# Patient Record
Sex: Female | Born: 1975 | Race: Black or African American | Hispanic: No | Marital: Single | Smoking: Former smoker
Health system: Southern US, Community
[De-identification: ages and names within clinical notes are randomized; demographics above are authoritative.]

## PROBLEM LIST (undated history)

## (undated) DIAGNOSIS — E119 Type 2 diabetes mellitus without complications: Secondary | ICD-10-CM

## (undated) DIAGNOSIS — I10 Essential (primary) hypertension: Secondary | ICD-10-CM

## (undated) HISTORY — PX: OTHER SURGICAL HISTORY: SHX169

---

## 1997-09-20 ENCOUNTER — Encounter: Admission: RE | Admit: 1997-09-20 | Discharge: 1997-09-20 | Payer: Self-pay | Admitting: Obstetrics

## 1997-09-20 ENCOUNTER — Other Ambulatory Visit: Admission: RE | Admit: 1997-09-20 | Discharge: 1997-09-20 | Payer: Self-pay | Admitting: Obstetrics

## 1998-07-16 ENCOUNTER — Encounter: Payer: Self-pay | Admitting: Emergency Medicine

## 1998-07-16 ENCOUNTER — Emergency Department (HOSPITAL_COMMUNITY): Admission: EM | Admit: 1998-07-16 | Discharge: 1998-07-16 | Payer: Self-pay | Admitting: Emergency Medicine

## 2002-05-02 ENCOUNTER — Emergency Department (HOSPITAL_COMMUNITY): Admission: EM | Admit: 2002-05-02 | Discharge: 2002-05-02 | Payer: Self-pay | Admitting: Emergency Medicine

## 2002-05-09 ENCOUNTER — Emergency Department (HOSPITAL_COMMUNITY): Admission: EM | Admit: 2002-05-09 | Discharge: 2002-05-09 | Payer: Self-pay | Admitting: Emergency Medicine

## 2002-11-03 ENCOUNTER — Ambulatory Visit (HOSPITAL_COMMUNITY): Admission: RE | Admit: 2002-11-03 | Discharge: 2002-11-03 | Payer: Self-pay | Admitting: Family Medicine

## 2003-05-16 ENCOUNTER — Encounter: Admission: RE | Admit: 2003-05-16 | Discharge: 2003-05-16 | Payer: Self-pay | Admitting: Family Medicine

## 2003-05-23 ENCOUNTER — Encounter: Admission: RE | Admit: 2003-05-23 | Discharge: 2003-05-23 | Payer: Self-pay | Admitting: Family Medicine

## 2003-06-13 ENCOUNTER — Encounter: Admission: RE | Admit: 2003-06-13 | Discharge: 2003-06-13 | Payer: Self-pay | Admitting: Family Medicine

## 2003-06-18 ENCOUNTER — Ambulatory Visit (HOSPITAL_COMMUNITY): Admission: RE | Admit: 2003-06-18 | Discharge: 2003-06-18 | Payer: Self-pay | Admitting: Surgery

## 2003-11-22 ENCOUNTER — Ambulatory Visit: Payer: Self-pay | Admitting: *Deleted

## 2004-03-14 ENCOUNTER — Ambulatory Visit: Payer: Self-pay | Admitting: Internal Medicine

## 2004-04-09 ENCOUNTER — Ambulatory Visit: Payer: Self-pay | Admitting: Family Medicine

## 2004-04-10 ENCOUNTER — Encounter (INDEPENDENT_AMBULATORY_CARE_PROVIDER_SITE_OTHER): Payer: Self-pay | Admitting: Family Medicine

## 2005-11-09 ENCOUNTER — Ambulatory Visit: Payer: Self-pay | Admitting: Family Medicine

## 2006-04-26 ENCOUNTER — Ambulatory Visit: Payer: Self-pay | Admitting: Family Medicine

## 2006-10-27 ENCOUNTER — Encounter (INDEPENDENT_AMBULATORY_CARE_PROVIDER_SITE_OTHER): Payer: Self-pay | Admitting: *Deleted

## 2006-11-26 ENCOUNTER — Encounter (INDEPENDENT_AMBULATORY_CARE_PROVIDER_SITE_OTHER): Payer: Self-pay | Admitting: Family Medicine

## 2006-11-26 DIAGNOSIS — I10 Essential (primary) hypertension: Secondary | ICD-10-CM | POA: Insufficient documentation

## 2006-11-26 DIAGNOSIS — J45909 Unspecified asthma, uncomplicated: Secondary | ICD-10-CM | POA: Insufficient documentation

## 2006-11-26 DIAGNOSIS — N949 Unspecified condition associated with female genital organs and menstrual cycle: Secondary | ICD-10-CM | POA: Insufficient documentation

## 2006-11-26 DIAGNOSIS — L680 Hirsutism: Secondary | ICD-10-CM | POA: Insufficient documentation

## 2006-11-26 DIAGNOSIS — L259 Unspecified contact dermatitis, unspecified cause: Secondary | ICD-10-CM | POA: Insufficient documentation

## 2006-11-26 DIAGNOSIS — E282 Polycystic ovarian syndrome: Secondary | ICD-10-CM | POA: Insufficient documentation

## 2006-12-08 ENCOUNTER — Encounter (INDEPENDENT_AMBULATORY_CARE_PROVIDER_SITE_OTHER): Payer: Self-pay | Admitting: *Deleted

## 2006-12-08 DIAGNOSIS — E8881 Metabolic syndrome: Secondary | ICD-10-CM

## 2007-01-11 ENCOUNTER — Ambulatory Visit: Payer: Self-pay | Admitting: Family Medicine

## 2007-02-28 ENCOUNTER — Ambulatory Visit: Payer: Self-pay | Admitting: Family Medicine

## 2007-02-28 ENCOUNTER — Encounter (INDEPENDENT_AMBULATORY_CARE_PROVIDER_SITE_OTHER): Payer: Self-pay | Admitting: Family Medicine

## 2007-02-28 DIAGNOSIS — A5901 Trichomonal vulvovaginitis: Secondary | ICD-10-CM

## 2007-02-28 LAB — CONVERTED CEMR LAB
Blood Glucose, Fingerstick: 96
Chlamydia, DNA Probe: NEGATIVE
Ketones, urine, test strip: NEGATIVE
Nitrite: NEGATIVE

## 2007-03-01 ENCOUNTER — Encounter (INDEPENDENT_AMBULATORY_CARE_PROVIDER_SITE_OTHER): Payer: Self-pay | Admitting: Family Medicine

## 2007-03-01 LAB — CONVERTED CEMR LAB
Albumin: 4 g/dL (ref 3.5–5.2)
CO2: 24 meq/L (ref 19–32)
Calcium: 8.9 mg/dL (ref 8.4–10.5)
Chloride: 108 meq/L (ref 96–112)
Cholesterol: 168 mg/dL (ref 0–200)
Eosinophils Relative: 1 % (ref 0–5)
GC Probe Amp, Urine: NEGATIVE
Glucose, Bld: 77 mg/dL (ref 70–99)
HCT: 42.8 % (ref 36.0–46.0)
Lymphocytes Relative: 29 % (ref 12–46)
Lymphs Abs: 2.1 10*3/uL (ref 0.7–4.0)
Neutrophils Relative %: 63 % (ref 43–77)
Platelets: 325 10*3/uL (ref 150–400)
Potassium: 4.2 meq/L (ref 3.5–5.3)
Sodium: 143 meq/L (ref 135–145)
Total Protein: 7.3 g/dL (ref 6.0–8.3)
Triglycerides: 110 mg/dL (ref <150)
WBC: 7.4 10*3/uL (ref 4.0–10.5)

## 2007-05-31 ENCOUNTER — Telehealth (INDEPENDENT_AMBULATORY_CARE_PROVIDER_SITE_OTHER): Payer: Self-pay | Admitting: Family Medicine

## 2007-05-31 ENCOUNTER — Encounter (INDEPENDENT_AMBULATORY_CARE_PROVIDER_SITE_OTHER): Payer: Self-pay | Admitting: Family Medicine

## 2008-04-04 ENCOUNTER — Encounter (INDEPENDENT_AMBULATORY_CARE_PROVIDER_SITE_OTHER): Payer: Self-pay | Admitting: Family Medicine

## 2008-04-04 ENCOUNTER — Ambulatory Visit: Payer: Self-pay | Admitting: Family Medicine

## 2008-04-04 LAB — CONVERTED CEMR LAB
Alkaline Phosphatase: 43 units/L (ref 39–117)
BUN: 8 mg/dL (ref 6–23)
Bilirubin Urine: NEGATIVE
Creatinine, Ser: 0.85 mg/dL (ref 0.40–1.20)
Eosinophils Absolute: 0.1 10*3/uL (ref 0.0–0.7)
Eosinophils Relative: 2 % (ref 0–5)
Glucose, Bld: 78 mg/dL (ref 70–99)
Glucose, Urine, Semiquant: NEGATIVE
HCT: 41.8 % (ref 36.0–46.0)
Hemoglobin: 14.3 g/dL (ref 12.0–15.0)
Hgb A1c MFr Bld: 6.1 %
Lymphs Abs: 2.3 10*3/uL (ref 0.7–4.0)
MCV: 89.3 fL (ref 78.0–100.0)
Monocytes Absolute: 0.6 10*3/uL (ref 0.1–1.0)
Platelets: 319 10*3/uL (ref 150–400)
Sodium: 142 meq/L (ref 135–145)
TSH: 0.913 microintl units/mL (ref 0.350–4.50)
Total Bilirubin: 0.5 mg/dL (ref 0.3–1.2)
Total Protein: 8 g/dL (ref 6.0–8.3)
Urobilinogen, UA: 0.2
WBC: 7.3 10*3/uL (ref 4.0–10.5)
pH: 5

## 2008-04-13 ENCOUNTER — Telehealth (INDEPENDENT_AMBULATORY_CARE_PROVIDER_SITE_OTHER): Payer: Self-pay | Admitting: Family Medicine

## 2008-05-14 ENCOUNTER — Encounter (INDEPENDENT_AMBULATORY_CARE_PROVIDER_SITE_OTHER): Payer: Self-pay | Admitting: Family Medicine

## 2009-01-24 ENCOUNTER — Ambulatory Visit: Payer: Self-pay | Admitting: Physician Assistant

## 2009-01-24 DIAGNOSIS — M549 Dorsalgia, unspecified: Secondary | ICD-10-CM | POA: Insufficient documentation

## 2009-01-24 LAB — CONVERTED CEMR LAB
ALT: 12 units/L (ref 0–35)
AST: 10 units/L (ref 0–37)
Alkaline Phosphatase: 42 units/L (ref 39–117)
Blood Glucose, Fingerstick: 87
Creatinine, Ser: 0.81 mg/dL (ref 0.40–1.20)
Hgb A1c MFr Bld: 5.6 %
Sodium: 141 meq/L (ref 135–145)
Total Bilirubin: 0.6 mg/dL (ref 0.3–1.2)
Total Protein: 7.2 g/dL (ref 6.0–8.3)

## 2009-01-29 ENCOUNTER — Encounter: Payer: Self-pay | Admitting: Physician Assistant

## 2009-05-02 ENCOUNTER — Ambulatory Visit: Payer: Self-pay | Admitting: Physician Assistant

## 2009-05-02 DIAGNOSIS — J039 Acute tonsillitis, unspecified: Secondary | ICD-10-CM

## 2009-05-02 DIAGNOSIS — R82998 Other abnormal findings in urine: Secondary | ICD-10-CM

## 2009-05-03 ENCOUNTER — Encounter: Payer: Self-pay | Admitting: Physician Assistant

## 2009-05-03 DIAGNOSIS — E785 Hyperlipidemia, unspecified: Secondary | ICD-10-CM

## 2009-05-08 ENCOUNTER — Encounter: Payer: Self-pay | Admitting: Physician Assistant

## 2009-05-13 ENCOUNTER — Encounter (INDEPENDENT_AMBULATORY_CARE_PROVIDER_SITE_OTHER): Payer: Self-pay | Admitting: *Deleted

## 2009-05-29 ENCOUNTER — Ambulatory Visit: Payer: Self-pay | Admitting: Physician Assistant

## 2009-05-30 ENCOUNTER — Ambulatory Visit: Payer: Self-pay | Admitting: Physician Assistant

## 2009-05-30 LAB — CONVERTED CEMR LAB
Nitrite: NEGATIVE
Specific Gravity, Urine: 1.025
Urobilinogen, UA: 0.2
WBC Urine, dipstick: NEGATIVE
pH: 6.5

## 2009-06-05 DIAGNOSIS — R809 Proteinuria, unspecified: Secondary | ICD-10-CM | POA: Insufficient documentation

## 2009-06-14 ENCOUNTER — Encounter: Payer: Self-pay | Admitting: Physician Assistant

## 2009-09-20 ENCOUNTER — Telehealth: Payer: Self-pay | Admitting: Physician Assistant

## 2009-09-21 ENCOUNTER — Emergency Department (HOSPITAL_COMMUNITY): Admission: EM | Admit: 2009-09-21 | Discharge: 2009-09-21 | Payer: Self-pay | Admitting: Emergency Medicine

## 2009-09-26 ENCOUNTER — Ambulatory Visit: Payer: Self-pay | Admitting: Nurse Practitioner

## 2009-09-26 DIAGNOSIS — R300 Dysuria: Secondary | ICD-10-CM | POA: Insufficient documentation

## 2009-09-26 DIAGNOSIS — H10029 Other mucopurulent conjunctivitis, unspecified eye: Secondary | ICD-10-CM

## 2009-09-26 LAB — CONVERTED CEMR LAB
Glucose, Urine, Semiquant: NEGATIVE
Nitrite: NEGATIVE
Specific Gravity, Urine: 1.02
pH: 5.5

## 2009-09-27 ENCOUNTER — Encounter (INDEPENDENT_AMBULATORY_CARE_PROVIDER_SITE_OTHER): Payer: Self-pay | Admitting: Nurse Practitioner

## 2009-09-30 ENCOUNTER — Encounter (INDEPENDENT_AMBULATORY_CARE_PROVIDER_SITE_OTHER): Payer: Self-pay | Admitting: Nurse Practitioner

## 2009-11-19 ENCOUNTER — Emergency Department (HOSPITAL_COMMUNITY): Admission: EM | Admit: 2009-11-19 | Discharge: 2009-11-19 | Payer: Self-pay | Admitting: Family Medicine

## 2010-03-01 ENCOUNTER — Encounter: Payer: Self-pay | Admitting: Family Medicine

## 2010-03-02 ENCOUNTER — Encounter: Payer: Self-pay | Admitting: Family Medicine

## 2010-03-09 LAB — CONVERTED CEMR LAB
Alkaline Phosphatase: 43 units/L (ref 39–117)
Beta hcg, urine, semiquantitative: NEGATIVE
Blood in Urine, dipstick: NEGATIVE
CO2: 28 meq/L (ref 19–32)
Cholesterol, target level: 200 mg/dL
Cholesterol: 165 mg/dL (ref 0–200)
Creatinine, Ser: 0.78 mg/dL (ref 0.40–1.20)
GC Probe Amp, Genital: NEGATIVE
Glucose, Bld: 85 mg/dL (ref 70–99)
HDL goal, serum: 40 mg/dL
Ketones, urine, test strip: NEGATIVE
LDL Cholesterol: 115 mg/dL — ABNORMAL HIGH (ref 0–99)
LDL Goal: 100 mg/dL
Nitrite: NEGATIVE
Pap Smear: NEGATIVE
Protein, U semiquant: 100
Rapid HIV Screen: NEGATIVE
Sodium: 141 meq/L (ref 135–145)
Total Bilirubin: 0.8 mg/dL (ref 0.3–1.2)
Total CHOL/HDL Ratio: 5.7
Total Protein: 7.7 g/dL (ref 6.0–8.3)
Triglycerides: 107 mg/dL (ref <150)
VLDL: 21 mg/dL (ref 0–40)
Whiff Test: NEGATIVE
pH: 7

## 2010-03-13 NOTE — Letter (Signed)
Summary: *HSN Results Follow up  [facility]  [address] [place], [place] [address]   Phone: [phone]  Fax: [phone]      09/30/2009   [facility] [address] [place], [place]  [address]   Dear  Ms. Nialah Castleman,                            ____S.Drinkard,FNP   ____D. Gore,FNP       ____B. McPherson,MD   ____V. Rankins,MD    ____E. Mulberry,MD    _X___N. Daphine Deutscher, FNP  ____D. Reche Dixon, MD    ____K. Philipp Deputy, MD    ____Other     This letter is to inform you that your recent test(s):  _______Pap Smear    ____X___Lab Test     _______X-ray    ___X____ is within acceptable limits  _______ requires a medication change  _______ requires a follow-up lab visit  _______ requires a follow-up visit with your provider   Comments: Urine test done during recent office visit is normal.       _________________________________________________________ If you have any questions, please contact our office [phone].                    Sincerely,    Lehman Prom FNP [facility]

## 2010-03-13 NOTE — Letter (Signed)
Summary: COUNSELING  COUNSELING   Imported By: Arta Bruce 06/24/2009 11:17:15  _____________________________________________________________________  External Attachment:    Type:   Image     Comment:   External Document

## 2010-03-13 NOTE — Letter (Signed)
Summary: *HSN Results Follow up  [facility]  [address] [place], [place] [address]   Phone: [phone]  Fax: [phone]      05/13/2009   [facility] [address] [place], [place]  [address]   Dear  Ms. Jilliana Swint,                            ____S.Drinkard,FNP   ____D. Gore,FNP       ____B. McPherson,MD   ____V. Rankins,MD    ____E. Mulberry,MD    ____N. Daphine Deutscher, FNP  ____D. Reche Dixon, MD    ____K. Philipp Deputy, MD    ____Other     This letter is to inform you that your recent test(s):  _______Pap Smear    _______Lab Test     _______X-ray    _______ is within acceptable limits  _______ requires a medication change  _______ requires a follow-up lab visit  _______ requires a follow-up visit with your provider   Comments:  We have been trying to reach you.  Please give the office a call at your earliest convenience.       _________________________________________________________ If you have any questions, please contact our office                     Sincerely,  Armenia Shannon [facility]

## 2010-03-13 NOTE — Letter (Signed)
Summary: *HSN Results Follow up  [facility]  [address] [place], [place] [address]   Phone: [phone]  Fax: [phone]      05/08/2009   [facility] [address] [place], [place]  [address]   Dear  Ms. Gloria Burgess,                            ____S.Drinkard,FNP   ____D. Gore,FNP       ____B. McPherson,MD   ____V. Rankins,MD    ____E. Mulberry,MD    ____N. Daphine Deutscher, FNP  ____D. Reche Dixon, MD    ____K. Philipp Deputy, MD    __x__S. Alben Spittle, PA-C     This letter is to inform you that your recent test(s):  ____x___Pap Smear    _______Lab Test     _______X-ray    ____x___ is within acceptable limits  _______ requires a medication change  _______ requires a follow-up lab visit  _______ requires a follow-up visit with your provider   Comments:       _________________________________________________________ If you have any questions, please contact our office                     Sincerely,  Tereso Newcomer PA-C [facility]

## 2010-03-13 NOTE — Progress Notes (Signed)
Summary: Office Visit/DEPRESSION SCREENING  Office Visit/DEPRESSION SCREENING   Imported By: Arta Bruce 07/04/2009 12:54:14  _____________________________________________________________________  External Attachment:    Type:   Image     Comment:   External Document

## 2010-03-13 NOTE — Assessment & Plan Note (Signed)
Summary: Acute - Abdominal pain   Vital Signs:  Patient profile:   35 year old female Menstrual status:  regular LMP:     08/2009 Weight:      208.1 pounds Temp:     97.8 degrees F oral Pulse rate:   77 / minute Pulse rhythm:   regular Resp:     20 per minute BP sitting:   115 / 75  (left arm) Cuff size:   large  Vitals Entered By: Levon Hedger (September 26, 2009 11:30 AM) CC: pt went to urgent care and was treated for pink eye and dianosed with URI.  she is complaining of lowerabdominal pain x 5 days .Marland KitchenMarland Kitchenpt is asking for a pregancy test Is Patient Diabetic? No Pain Assessment Patient in pain? yes     Location: lower abdomen  Does patient need assistance? Ambulation Normal LMP (date): 08/2009 LMP - Character: normal     Menstrual flow (days): 4-5 Enter LMP: 08/2009 Last PAP Result NEGATIVE FOR INTRAEPITHELIAL LESIONS OR MALIGNANCY. R  Primary Care Provider:  Tereso Newcomer PA-C  CC:  pt went to urgent care and was treated for pink eye and dianosed with URI.  she is complaining of lowerabdominal pain x 5 days .Marland KitchenMarland Kitchenpt is asking for a pregancy test.  History of Present Illness:  Pt went to urgent care 5 days ago and was Dx with URI. Pt was started on zyrtec, of which she is taking. Pt was also dx with bacterial conjunctivitis for which she has been using eye drops.  originally in the right eye but that has since cleared since using the eye drops. Now same symptoms present in the left eye and she has started using the eye drops in that eye  Also with complaints of abdominal pain intermittent, mostly on the right side +vaginal discharge + dysuria Pt has a menses monthly (although history of PCOS noted in chart)  She has never had any children.   BM usually three times per day - soft, denies any constipation  Pt reports that she just started working at [employer] and it is physical.  Requires her to lift.    Allergies (verified): No Known Drug Allergies  Review of  Systems Eyes:  Complains of red eye. CV:  Denies chest pain or discomfort. Resp:  Denies cough. GI:  Complains of abdominal pain; denies nausea and vomiting. GU:  Complains of discharge and dysuria.  Physical Exam  General:  alert.   Eyes:  PERRLA bilaterally left eye - pink right eye - normal sclera Lungs:  normal breath sounds.   Heart:  normal rate and regular rhythm.   Abdomen:  obese BS x 4, nontender  Msk:  normal ROM.   Neurologic:  alert & oriented X3.   Skin:  color normal.   Psych:  Oriented X3.     Impression & Recommendations:  Problem # 1:  DYSURIA (ICD-788.1) will send urine for culture Orders: KOH/ WET Mount [id number])  Problem # 2:  POLYCYSTIC OVARIAN DISEASE (ICD-256.4) pt is reporting regular menses now however abdominal pain may be due to PCOS or strained muscles from work  Problem # 3:  CONJUNCTIVITIS, BACTERIAL (ICD-372.03) continue eye drops as per ER  Complete Medication List: 1)  Glucophage Xr 500 Mg Tb24 (Metformin hcl) .Marland Kitchen.. 1 tablet by mouth daily for blood sugar 2)  Lisinopril-hydrochlorothiazide 10-12.5 Mg Tabs (Lisinopril-hydrochlorothiazide) .Marland Kitchen.. 1 tablet by mouth daily for blood pressure 3)  Triamcinolone Acetonide 0.1 % Crea (Triamcinolone acetonide) .... Apply small  amount to eczema rash and rub in well two times a day until improved. 4)  Naprosyn 500 Mg Tabs (Naproxen) .... Take 1 tablet by mouth two times a day as needed for pain 5)  Proventil Hfa 108 (90 Base) Mcg/act Aers (Albuterol sulfate) .Marland Kitchen.. 1-2 puffs every 4-6 hours as needed 6)  Flovent Hfa 110 Mcg/act Aero (Fluticasone propionate  hfa) .... 2 puffs two times a day 7)  Pravastatin Sodium 20 Mg Tabs (Pravastatin sodium) .... Take 1 tab by mouth at bedtime for cholesterol  Other Orders: T-GC Probe, urine ([id number]) T-Culture, Urine ([id number])  Patient Instructions: 1)  Continue to use eye drops as ordered 2)  Your urine will be sent for culture to check for bacteria. 3)   You will be notified of the results. 4)  Abdominal pain may be from lifting at work  Laboratory Results   Urine Tests  Date/Time Received: September 26, 2009 11:44 AM   Routine Urinalysis   Color: brown Glucose: negative   (Normal Range: Negative) Bilirubin: small   (Normal Range: Negative) Ketone: trace (5)   (Normal Range: Negative) Spec. Gravity: 1.020   (Normal Range: 1.003-1.035) Blood: trace-intact   (Normal Range: Negative) pH: 5.5   (Normal Range: 5.0-8.0) Protein: negative   (Normal Range: Negative) Urobilinogen: 1.0   (Normal Range: 0-1) Nitrite: negative   (Normal Range: Negative) Leukocyte Esterace: small   (Normal Range: Negative)      Wet Mount/KOH Source: vaginal WBC/hpf: 1-5 Bacteria/hpf: rare Clue cells/hpf: none Yeast/hpf: none Trichomonas/hpf: none   Prevention & Chronic Care Immunizations   Influenza vaccine: Historical  (11/14/2002)    Tetanus booster: 02/12/2003: Historical    Pneumococcal vaccine: Not documented  Other Screening   Pap smear: NEGATIVE FOR INTRAEPITHELIAL LESIONS OR MALIGNANCY.  (05/02/2009)   Smoking status: current  (01/11/2007)  Lipids   Total Cholesterol: 165  (05/02/2009)   LDL: 115  (05/02/2009)   LDL Direct: Not documented   HDL: 29  (05/02/2009)   Triglycerides: 107  (05/02/2009)    SGOT (AST): 12  (05/02/2009)   SGPT (ALT): 10  (05/02/2009)   Alkaline phosphatase: 43  (05/02/2009)   Total bilirubin: 0.8  (05/02/2009)  Hypertension   Last Blood Pressure: 115 / 75  (09/26/2009)   Serum creatinine: 0.78  (05/02/2009)   Serum potassium 4.3  (05/02/2009)  Self-Management Support :    Hypertension self-management support: Not documented    Lipid self-management support: Not documented    Laboratory Results   Urine Tests    Routine Urinalysis   Color: brown Glucose: negative   (Normal Range: Negative) Bilirubin: small   (Normal Range: Negative) Ketone: trace (5)   (Normal Range: Negative) Spec.  Gravity: 1.020   (Normal Range: 1.003-1.035) Blood: trace-intact   (Normal Range: Negative) pH: 5.5   (Normal Range: 5.0-8.0) Protein: negative   (Normal Range: Negative) Urobilinogen: 1.0   (Normal Range: 0-1) Nitrite: negative   (Normal Range: Negative) Leukocyte Esterace: small   (Normal Range: Negative)      Wet Mount Wet Mount KOH: Negative

## 2010-03-13 NOTE — Progress Notes (Signed)
Summary: POSSIBLE PINK EYE & STREPTHROAT   Phone Note Call from Patient   Caller: Patient Reason for Call: Acute Illness Summary of Call: PT HAVE A POSSIBLE PINK EYE AND STREP THROAT AND SHE WANTS TO SEE PA WEAVER . Fuller Song HER @ [id number] THANK YOU  Initial call taken by: Cheryll Dessert,  September 20, 2009 10:55 AM  Follow-up for Phone Call        spoke with pt and she says her right eye is drain and closed and covered unable to open it.... pt says for four days now her thoart has been scatchy.... pt says her sinus is acting up... pt says she is congested in her facial area... pt says she has took advil but its not working... pt says she has been gargling with salt water for her throat... [facility]... if the script is cheap you can send to [facility] [place]... Follow-up by: Arm[place] Shannon,  September 20, 2009 12:38 PM  Additional Follow-up for Phone Call Additional follow up Details #1::        Triage RN visit Additional Follow-up by: Brynda Rim,  September 20, 2009 2:08 PM    Additional Follow-up for Phone Call Additional follow up Details #2::    Left message on answering machine for pt to return call.Marland KitchenMarland KitchenArmenia Shannon, RMA  September 20, 2009 2:43 PM   advise pt to go to urgent care since she was unable to reach for appt here Follow-up by: Arm[place] Shannon,  September 20, 2009 4:55 PM

## 2010-03-13 NOTE — Assessment & Plan Note (Signed)
Summary: cpp exam//gk   Vital Signs:  Patient profile:   35 year old female Menstrual status:  regular LMP:     04/15/2009 Height:      66.5 inches Weight:      210.7 pounds O2 Sat:      97 % Temp:     98.4 degrees F oral Pulse rate:   74 / minute Pulse rhythm:   regular Resp:     18 per minute BP sitting:   123 / 80  (left arm) Cuff size:   large  Vitals Entered By: Geanie Cooley  (May 02, 2009 10:35 AM)  Serial Vital Signs/Assessments:  Comments: 11:11 AM Peak Flows: 420,260,250 By: Vesta Mixer CMA   CC: Pt here for CPP. Pt states that she has been having some discharge it was cloudy and white, then it was a dark brown, now its back to a cloudy white color. Pt states she has a little odor but no itching. Pt states she has been having some cramping as well even when her cycle isnt on., Hypertension Management Is Patient Diabetic? No Pain Assessment Patient in pain? no      CBG Result 87  Does patient need assistance? Functional Status Self care Ambulation Normal LMP (date): 04/15/2009 LMP - Character: normal     Menstrual flow (days): 4-5 Menstrual Status regular Enter LMP: 04/15/2009 Last PAP Result NEGATIVE FOR INTRAEPITHELIAL LESIONS OR MALIGNANCY.   CC:  Pt here for CPP. Pt states that she has been having some discharge it was cloudy and white, then it was a dark brown, now its back to a cloudy white color. Pt states she has a little odor but no itching. Pt states she has been having some cramping as well even when her cycle isnt on., and Hypertension Management.  History of Present Illness: Here for CPP No h/o abnl pap. No abnl bleeding. She is c/o a white discharge.  "Clear-cloudy then brown then cloudy again."  No odor. Sexually active with one partner.  Having some pelvic pain. No dysuria.  No hematuria. No FHx of breast cancer. Not taking calcium.  PHQ9=11 - Increased social stressors.  No med for depression in past.  No suicidal ideations.     C/o cold symptoms x 2 days.  Has right sided throat pain.  No difficulty breathing.  No fever.  No cough.  No otalgia.      Asthma History    Asthma Control Assessment:    Age range: 12+ years    Symptoms: 0-2 days/week    Nighttime Awakenings: 0-2/month    Interferes w/ normal activity: no limitations    SABA use (not for EIB): 0-2 days/week    Asthma Control Assessment: Well Controlled  Hypertension History:      She denies headache, chest pain, dyspnea with exertion, peripheral edema, and syncope.  She notes no problems with any antihypertensive medication side effects.        Positive major cardiovascular risk factors include hypertension and current tobacco user.  Negative major cardiovascular risk factors include female age less than 52 years old and negative family history for ischemic heart disease.      Problems Prior to Update: 1)  Urinalysis, Abnormal  (ICD-791.9) 2)  Acute Tonsillitis  (ICD-463) 3)  Family Stress  (ICD-V61.9) 4)  Back Pain  (ICD-724.5) 5)  Examination, Routine Medical  (ICD-V70.0) 6)  Screening For Malignant Neoplasm, Cervix  (ICD-V76.2) 7)  Trichomonal Vulvovaginitis  (ICD-131.01) 8)  Screening For Malignant Neoplasm,  Cervix  (ICD-V76.2) 9)  Metabolic Syndrome X  (ICD-277.7) 10)  Dysfunctional Uterine Bleeding  (ICD-626.8) 11)  Polycystic Ovarian Disease  (ICD-256.4) 12)  Hirsutism  (ICD-704.1) 13)  Asthma, Childhood  (ICD-493.00) 14)  Dermatitis, Hands  (ICD-692.9) 15)  Hypertension  (ICD-401.9)  Allergies (verified): No Known Drug Allergies  Past History:  Past Medical History: Last updated: 01/24/2009 Current Problems:  DYSFUNCTIONAL UTERINE BLEEDING (ICD-626.8) POLYCYSTIC OVARIAN DISEASE (ICD-256.4) Metabolic Syndrome X HIRSUTISM (ICD-704.1) ASTHMA, CHILDHOOD (ICD-493.00) DERMATITIS, HANDS (ICD-692.9) HYPERTENSION (ICD-401.9)  Past Surgical History: Last updated: 11/26/2006 September 2004: Pelvic ultrasound:PCOS April 2005:  Breast abscess  Family History: Reviewed history from 01/11/2007 and no changes required. Mother living DM,HTN Father living DM   Pt denies family history of breast/uterine/cervical/ovarian/colon or lung cancers  Social History: Reviewed history from 04/04/2008 and no changes required. 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 uses MJ 3x per week. Regular exercise-yes  Review of Systems       See HPI. Rest of ROS negative.  Physical Exam  General:  alert, well-developed, and well-nourished.   Head:  normocephalic and atraumatic.   Eyes:  pupils equal, pupils round, and pupils reactive to light.   Ears:  R ear normal and L ear normal.   Nose:  no external deformity.   Mouth:  right tonsil 2-3+ and red no exudate  Neck:  supple, no thyromegaly, and no cervical lymphadenopathy.   Breasts:  skin/areolae normal, no masses, no abnormal thickening, no nipple discharge, no tenderness, and no adenopathy.   Lungs:  normal breath sounds, no crackles, and no wheezes.   Heart:  normal rate and regular rhythm.   Abdomen:  soft, non-tender, and normal bowel sounds.   Rectal:  no external abnormalities.   Genitalia:  Fundus difficult to palpate due to body habitus. Normal introitus, no external lesions, mucosa pink and moist, no vaginal or cervical lesions, no vaginal atrophy, no friaility or hemorrhage, no adnexal masses or tenderness, and vaginal discharge.   Msk:  normal ROM.   Extremities:  no edema  Neurologic:  alert & oriented X3 and cranial nerves II-XII intact.   Skin:  turgor normal.   Psych:  normally interactive, good eye contact, and not depressed appearing.     Impression & Recommendations:  Problem # 1:  FAMILY STRESS (ICD-V61.9)  PHQ9=11 denies suicidal ideations does not want meds has a 6 yo daughter that she is raising and is having probs discussed referral to LCSW and she would like to  go  Orders: Psychology Referral (Psychology)  Problem # 2:  ACUTE TONSILLITIS (ICD-463)  rx with augmentin  nsaids close f/u  Orders: Rapid Strep ([address])  Problem # 3:  ASTHMA, CHILDHOOD (ICD-493.00) stable  Her updated medication list for this problem includes:    Proventil Hfa 108 (90 Base) Mcg/act Aers (Albuterol sulfate) .Marland Kitchen... 1-2 puffs every 4-6 hours as needed    Flovent Hfa 110 Mcg/act Aero (Fluticasone propionate  hfa) .Marland Kitchen... 2 puffs two times a day  Problem # 4:  HYPERTENSION (ICD-401.9) controlled  Her updated medication list for this problem includes:    Lisinopril-hydrochlorothiazide 10-12.5 Mg Tabs (Lisinopril-hydrochlorothiazide) .Marland Kitchen... 1 tablet by mouth daily for blood pressure  Orders: T-Comprehensive Metabolic Panel [id number]) T-Urinalysis (75643-32951) EKG w/ Interpretation (93000)  Problem # 5:  METABOLIC SYNDROME X (ICD-277.7) check labs  Orders: T-Comprehensive Metabolic Panel ([id number]) T-Lipid Profile ([id number])  Problem # 6:  SCREENING FOR MALIGNANT NEOPLASM, CERVIX (ICD-V76.2)  Orders:  T-Pap Smear, Thin Prep (04540) T- GC Chlamydia (98119) KOH/ WET Mount [phone])  Problem # 7:  EXAMINATION, ROUTINE MEDICAL (ICD-V70.0)  Orders: T-HIV Antibody  (Reflex) ([id number]) T-Syphilis Test (RPR) ([id number]) T-Urinalysis (84132-44010)  Problem # 8:  TRICHOMONAL VULVOVAGINITIS (ICD-131.01) tx with metronidazole  Problem # 9:  URINALYSIS, ABNORMAL (ICD-791.9) may be related to trich infxn check culture repeat u/a in 4 weeks  Orders: T-Culture, Urine ([id number]) T- * Misc. Laboratory test [phone])  Complete Medication List: 1)  Glucophage Xr 500 Mg Tb24 (Metformin hcl) .Marland Kitchen.. 1 tablet by mouth daily for blood sugar 2)  Lisinopril-hydrochlorothiazide 10-12.5 Mg Tabs (Lisinopril-hydrochlorothiazide) .Marland Kitchen.. 1 tablet by mouth daily for blood pressure 3)  Triamcinolone Acetonide 0.1 % Crea (Triamcinolone acetonide) .... Apply small  amount to eczema rash and rub in well two times a day until improved. 4)  Naprosyn 500 Mg Tabs (Naproxen) .... Take 1 tablet by mouth two times a day as needed for pain 5)  Proventil Hfa 108 (90 Base) Mcg/act Aers (Albuterol sulfate) .Marland Kitchen.. 1-2 puffs every 4-6 hours as needed 6)  Flovent Hfa 110 Mcg/act Aero (Fluticasone propionate  hfa) .... 2 puffs two times a day 7)  Augmentin 875-125 Mg Tabs (Amoxicillin-pot clavulanate) .... Take 1 tablet by mouth two times a day for 10 days 8)  Flagyl 500 Mg Tabs (Metronidazole) .... Take 1 tablet by mouth two times a day for 7 days  Other Orders: Capillary Blood Glucose/CBG (59563)  Hypertension Assessment/Plan:      The patient's hypertensive risk group is category B: At least one risk factor (excluding diabetes) with no target organ damage.  Her calculated 10 year risk of coronary heart disease is 4 %.  Today's blood pressure is 123/80.  Her blood pressure goal is < 140/90.  Patient Instructions: 1)  Schedule appt with Ethelene Browns. 2)  Take ibuprofen around the clock for 3 days, then as needed. 3)  Take 400-600 mg of Ibuprofen (Advil, Motrin) with food every 4-6 hours as needed  for relief of pain or comfort of fever.  4)  You have a vaginal infection.  Your partner needs treatment as well.  [facility]. 5)  Schedule follow up with Addaline Peplinski in one week to recheck throat. Prescriptions: FLAGYL 500 MG TABS (METRONIDAZOLE) Take 1 tablet by mouth two times a day for 7 days  #14 x 0   Entered and Authorized by:   Tereso Newcomer PA-C   Signed by:   Tereso Newcomer PA-C on 05/02/2009   Method used:   Printed then faxed to ...       [facility] (retail)       [address] [place], [place]  [address]       Ph: [phone]       Fax: [phone]   RxID:   [id number] AUGMENTIN 875-125 MG TABS (AMOXICILLIN-POT CLAVULANATE) Take 1 tablet by mouth two times a day for 10 days  #20 x 0    Entered and Authorized by:   Tereso Newcomer PA-C   Signed by:   Tereso Newcomer PA-C on 05/02/2009   Method used:   Printed then faxed to ...       [facility] (retail)       [address] [place], [place]  [address]       Ph: [phone]  Fax: [phone]   RxID:   [id number]   Laboratory Results   Urine Tests  Date/Time Received: May 02, 2009 11:16 AM   Routine Urinalysis   Color: yellow Appearance: Hazy Glucose: negative   (Normal Range: Negative) Bilirubin: small   (Normal Range: Negative) Ketone: negative   (Normal Range: Negative) Spec. Gravity: 1.015   (Normal Range: 1.003-1.035) Blood: negative   (Normal Range: Negative) pH: 7.0   (Normal Range: 5.0-8.0) Protein: 100   (Normal Range: Negative) Urobilinogen: 2.0   (Normal Range: 0-1) Nitrite: negative   (Normal Range: Negative) Leukocyte Esterace: moderate   (Normal Range: Negative)    Urine HCG: negative  Blood Tests     CBG Random:: 87mg /dL  Date/Time Received: May 02, 2009 12:38 PM   [employer] Mount Source: vaginal WBC/hpf: 5-10 Bacteria/hpf: 1+ Clue cells/hpf: few  Negative whiff Yeast/hpf: none Wet Mount KOH: Negative Trichomonas/hpf: moderate  Other Tests  Rapid HIV: negative     EKG  Procedure date:  05/02/2009  Findings:      Normal sinus rhythm with rate of:  78 normal axis nonspecific IVCD no isch changes

## 2010-03-13 NOTE — Progress Notes (Signed)
Summary: Office Visit/DEPRESSION SCREENING  Office Visit/DEPRESSION SCREENING   Imported By: Arta Bruce 03/12/2009 14:43:40  _____________________________________________________________________  External Attachment:    Type:   Image     Comment:   External Document

## 2010-04-23 LAB — POCT URINALYSIS DIPSTICK
Bilirubin Urine: NEGATIVE
Nitrite: NEGATIVE
pH: 5.5 (ref 5.0–8.0)

## 2010-04-23 LAB — GC/CHLAMYDIA PROBE AMP, GENITAL
Chlamydia, DNA Probe: NEGATIVE
GC Probe Amp, Genital: NEGATIVE

## 2010-04-23 LAB — WET PREP, GENITAL: Yeast Wet Prep HPF POC: NONE SEEN

## 2010-04-23 LAB — POCT PREGNANCY, URINE: Preg Test, Ur: NEGATIVE

## 2010-05-20 ENCOUNTER — Other Ambulatory Visit: Payer: Self-pay | Admitting: Nurse Practitioner

## 2010-06-04 ENCOUNTER — Ambulatory Visit (INDEPENDENT_AMBULATORY_CARE_PROVIDER_SITE_OTHER): Payer: Self-pay

## 2010-06-04 ENCOUNTER — Inpatient Hospital Stay (INDEPENDENT_AMBULATORY_CARE_PROVIDER_SITE_OTHER)
Admission: RE | Admit: 2010-06-04 | Discharge: 2010-06-04 | Disposition: A | Discharge status: Home / Self Care | Payer: Self-pay | Source: Ambulatory Visit | Attending: Emergency Medicine | Admitting: Emergency Medicine

## 2010-06-04 DIAGNOSIS — J069 Acute upper respiratory infection, unspecified: Secondary | ICD-10-CM

## 2010-06-04 DIAGNOSIS — J45909 Unspecified asthma, uncomplicated: Secondary | ICD-10-CM

## 2010-06-27 NOTE — Op Note (Signed)
NAME:  [name], PECKHAM                          ACCOUNT NO.:  [id number]   MEDICAL RECORD NO.:  [id number]                   PATIENT TYPE:  AMB   LOCATION:  DAY                                  FACILITY:  [facility]   PHYSICIAN:  Abigail Miyamoto, M.D.              DATE OF BIRTH:  October 09, 1975   DATE OF PROCEDURE:  06/18/2003  DATE OF DISCHARGE:                                 OPERATIVE REPORT   PREOPERATIVE DIAGNOSES:  Chronic left breast abscess.   POSTOPERATIVE DIAGNOSES:  Chronic left breast abscess.   PROCEDURE:  Incision and drainage of chronic breast abscess.   SURGEON:  Abigail Miyamoto, M.D.   ANESTHESIA:  1% lidocaine, 0.25% Marcaine and monitored anesthesia care.   ESTIMATED BLOOD LOSS:  Minimal.   INDICATIONS FOR PROCEDURE:  Gloria Burgess is a 35 year old female [facility] of a chronic abscess.  She had had  several aspirations of this abscess and had resolution of her symptoms on  antibiotics; however, a 2 x 3 cm collection has persisted therefore a  decision was then made to proceed to the operating room for open drainage.   DESCRIPTION OF PROCEDURE:  The patient was brought to the operating room,  identified as Jacqulyn Mcfarland. She was placed supine on the operating table and  anesthesia was induced. Her left breast was then prepped and draped in the  usual sterile fashion.  The skin on the medial portion of the areolar was  then anesthetized with 1% lidocaine with epinephrine.  An incision was then  made with a #15 blade. The incision was carried down to the abscess cavity  which was easily identified and entered with a hemostat. The cavity was  found to contain only serous fluid without evidence of purulence. The entire  contents of the cavity was expressed out and several septations were  compressed with blunt dissection.  The entire cavity appeared otherwise  normal without any evidence of carcinoma. The wound was then irrigated with  copious amounts of normal saline.  Hemostasis was then achieved with the  electrocautery. The surrounding tissue was then again anesthetized with  0.25% Marcaine. The wound was then packed with a wet to dry saline gauze.  The gauze was then placed over this. The patient was then taken in stable  condition from the operating room to the recovery room.  She tolerated the  procedure well, all counts were correct at the end of the procedure.                                               Abigail Miyamoto, M.D.    DB/MEDQ  D:  06/18/2003  T:  06/18/2003  Job:  [id number]

## 2010-12-12 ENCOUNTER — Inpatient Hospital Stay (INDEPENDENT_AMBULATORY_CARE_PROVIDER_SITE_OTHER)
Admission: RE | Admit: 2010-12-12 | Discharge: 2010-12-12 | Disposition: A | Discharge status: Home / Self Care | Payer: Self-pay | Source: Ambulatory Visit | Attending: Emergency Medicine | Admitting: Emergency Medicine

## 2010-12-12 ENCOUNTER — Ambulatory Visit (INDEPENDENT_AMBULATORY_CARE_PROVIDER_SITE_OTHER): Payer: Self-pay

## 2010-12-12 DIAGNOSIS — S40019A Contusion of unspecified shoulder, initial encounter: Secondary | ICD-10-CM

## 2011-08-10 ENCOUNTER — Other Ambulatory Visit: Payer: Self-pay | Admitting: Family Medicine

## 2012-06-03 IMAGING — CR DG CHEST 2V
2 series · 2 of 2 positions shown · non-contrast
Comparison: None.

CLINICAL DATA: Fever, cough, congestion and shortness of breath.
Low O2 sats.

CHEST - 2 VIEW

[view not recorded (1 of 2)]
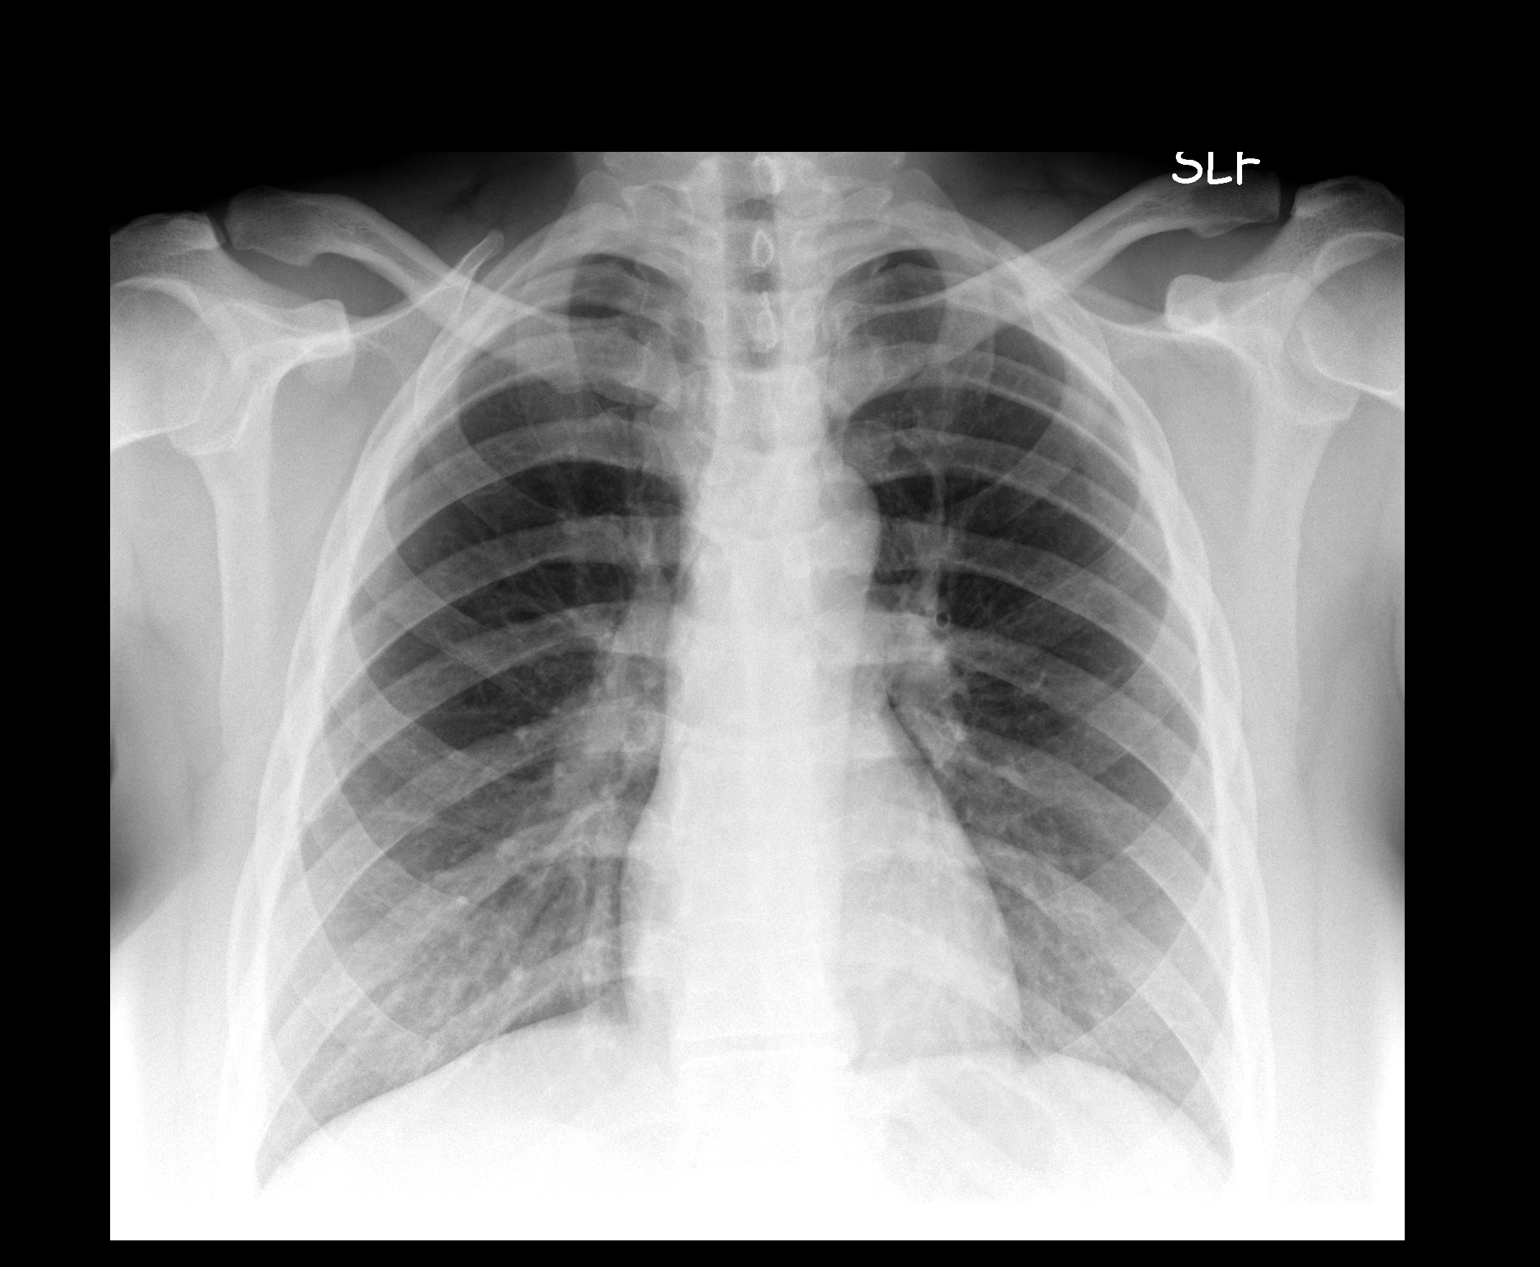

[view not recorded (2 of 2)]
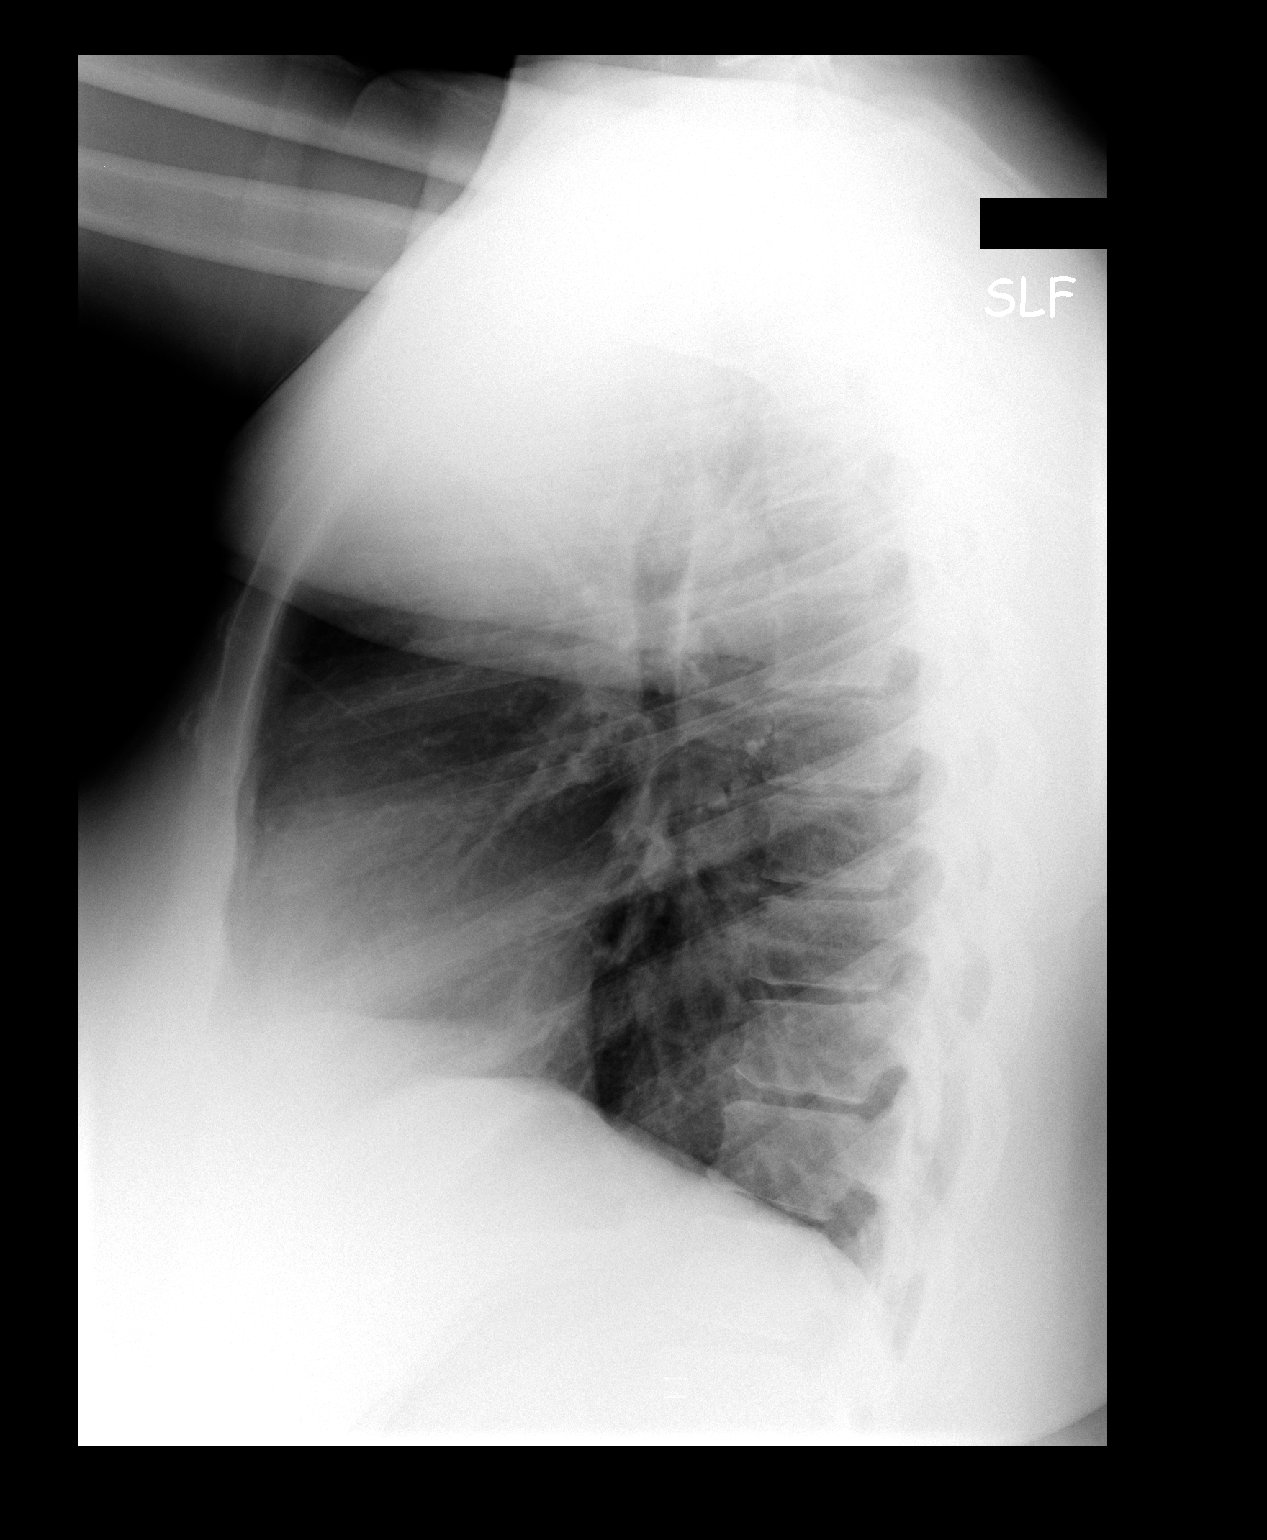

[2 of 2 positions shown; findings below may reference images not displayed]

FINDINGS: Trachea is midline.  Heart size normal.  Lungs are clear.
No pleural fluid.
IMPRESSION: No acute findings.

## 2012-06-30 ENCOUNTER — Other Ambulatory Visit: Payer: Self-pay | Admitting: Family Medicine

## 2012-06-30 DIAGNOSIS — N6320 Unspecified lump in the left breast, unspecified quadrant: Secondary | ICD-10-CM

## 2012-07-13 ENCOUNTER — Other Ambulatory Visit: Payer: Self-pay

## 2012-07-27 ENCOUNTER — Ambulatory Visit
Admission: RE | Admit: 2012-07-27 | Discharge: 2012-07-27 | Disposition: A | Discharge status: Home / Self Care | Payer: BC Managed Care – PPO | Source: Ambulatory Visit | Attending: Family Medicine | Admitting: Family Medicine

## 2012-07-27 DIAGNOSIS — N6320 Unspecified lump in the left breast, unspecified quadrant: Secondary | ICD-10-CM

## 2013-07-24 ENCOUNTER — Other Ambulatory Visit: Payer: Self-pay

## 2013-07-25 LAB — CYTOLOGY - PAP

## 2014-12-13 ENCOUNTER — Encounter (HOSPITAL_COMMUNITY): Payer: Self-pay

## 2014-12-13 ENCOUNTER — Emergency Department (INDEPENDENT_AMBULATORY_CARE_PROVIDER_SITE_OTHER)
Admission: EM | Admit: 2014-12-13 | Discharge: 2014-12-13 | Disposition: A | Discharge status: Home / Self Care | Payer: No Typology Code available for payment source | Source: Home / Self Care

## 2014-12-13 DIAGNOSIS — G540 Brachial plexus disorders: Secondary | ICD-10-CM

## 2014-12-13 MED ORDER — NAPROXEN 375 MG PO TABS: 375.0000 mg | ORAL_TABLET | Freq: Two times a day (BID) | ORAL | Status: DC

## 2014-12-13 NOTE — ED Provider Notes (Signed)
CSN: [id number]     Arrival date & time 12/13/14  1936 History   None    Chief Complaint  Patient presents with  . Arm Problem   (Consider location/radiation/quality/duration/timing/severity/associated sxs/prior Treatment) The history is provided by the patient.    History reviewed. No pertinent past medical history. History reviewed. No pertinent past surgical history. Family History  Problem Relation Age of Onset  . CAD Mother   . CAD Brother    Social History  Substance Use Topics  . Smoking status: Current Every Day Smoker  . Smokeless tobacco: None  . Alcohol Use: Yes   OB History    No data available     Review of Systems  Constitutional: Negative.   HENT: Negative.   Eyes: Negative.   Respiratory: Negative.   Cardiovascular: Negative.   Endocrine: Negative.   Genitourinary: Negative.   Musculoskeletal: Positive for myalgias.  Skin: Negative.   Allergic/Immunologic: Negative.   Neurological: Positive for numbness.  Hematological: Negative.   Psychiatric/Behavioral: Negative.     Allergies  Review of patient's allergies indicates no known allergies.  Home Medications   Prior to Admission medications   Medication Sig Start Date End Date Taking? Authorizing Provider  lisinopril-hydrochlorothiazide (PRINZIDE,ZESTORETIC) 10-12.5 MG tablet Take 1 tablet by mouth daily.   Yes Historical Provider, MD  metFORMIN (GLUCOPHAGE) 500 MG tablet Take by mouth 2 (two) times daily with a meal.   Yes Historical Provider, MD  naproxen (NAPROSYN) 375 MG tablet Take 1 tablet (375 mg total) by mouth 2 (two) times daily. 12/13/14   Deatra CanterWilliam J Shariyah Eland, FNP   Meds Ordered and Administered this Visit  Medications - No data to display  BP 126/88 mmHg  Pulse 76  Temp(Src) 99.4 F (37.4 C) (Oral)  SpO2 99% No data found.   Physical Exam  Constitutional: She appears well-developed and well-nourished.  HENT:  Head: Normocephalic and atraumatic.  Mouth/Throat: Oropharynx is  clear and moist.  Eyes: Conjunctivae and EOM are normal. Pupils are equal, round, and reactive to light.  Neck: Normal range of motion. Neck supple.  Cardiovascular: Normal rate, regular rhythm and normal heart sounds.   Pulmonary/Chest: Effort normal and breath sounds normal.  Musculoskeletal: She exhibits tenderness.  Tenderness in right shoulder with raising of right arm and elicits parasthesia.    ED Course  Procedures (including critical care time)  Labs Review Labs Reviewed - No data to display  Imaging Review No results found.   Visual Acuity Review  Right Eye Distance:   Left Eye Distance:   Bilateral Distance:    Right Eye Near:   Left Eye Near:    Bilateral Near:         MDM   1. Thoracic outlet syndrome    Naprosyn 375mg  one po bid x 10 days #20  Congratulated patient on quitting smoking and advised her to follow up with her PCP for wellness visit and discuss concerns about her family hx of CAD.  Explained to patient and reassured that at this time I feel this is a MS concern.  Anselm PancoastWilliam J Marcina Kinnison FNP    Deatra CanterWilliam J Shaneece Stockburger, FNP 12/13/14 2033

## 2014-12-13 NOTE — ED Notes (Signed)
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 (never was told what it was , still hurts) Today while sitting on bed, had sudden onset of pain and tingling in right bicep area , and started breathing fast, became sweaty for a brief period. Went outside for a walk, got better, "gave up smoking right then because I got scarred" NAD at present, w/d/color good

## 2014-12-13 NOTE — Discharge Instructions (Signed)
Thoracic Outlet Syndrome  Thoracic outlet syndrome (TOS) is a group of signs and symptoms that result when the vein, artery, or nerves that supply your arm and hand are squeezed (compressed). To reach your arm, all of these have to pass through a tight space under your collarbone and above your top rib (thoracic outlet).  There are three types of TOS:   Compression of the nerves that supply your arm and hand is called neurogenic TOS. Most people with TOS have this type.   Compression of the vein that returns blood from your arm and hand (subclavian vein) is called venous TOS.   Compression of the artery that carries blood to your arm and hand (subclavian artery) is called arterial TOS. Arterial TOS is the rarest type.  Depending on which structures are affected, you may have symptoms on one side or both sides of your body.  CAUSES   Neurogenic TOS may be caused by swelling or scarring in your neck muscles that results in the narrowing of your thoracic outlet. This leads to nerve compression. It can happen from:    Neck injuries from an auto accident (whiplash).    Falls.    Repetitive stress on your neck from working with your arms. This stress could be from using a keyboard all day or working on an assembly line.   Venous TOS may be caused by doing hard work with your arms, especially if you have to lift your arms above your head. A blood clot may form in the vein.   Arterial TOS may be caused by having an extra rib at the base of your neck (cervical rib). This rib presses on your subclavian artery. Over time, this pressure may cause a clot to form inside the artery, or the artery may weaken and balloon outward (aneurysm).  RISK FACTORS   You may be at greater risk for neurogenic TOS after a neck injury or repetitive stress on your neck.   You may be at greater risk for venous TOS if you do strenuous and repetitive work with your arms.   You may be at greater risk for arterial TOS if you were born with a  cervical rib.  Risk factors for any type of TOS include:   Being female.   Being overweight.   Having poor posture.  SIGNS AND SYMPTOMS   Your signs and symptoms will depend on the type of TOS that you have.  Signs and symptoms of neurogenic TOS may include:   Pain in your shoulder, arm, or hand.   Tingling or numbness in your shoulder, arm, or hand.   Tiredness or weakness of your shoulder, arm, or hand.   Neck pain.   Headache.  Signs and symptoms of venous TOS may include:   Pain and swelling of your whole arm.   Arm skin that is darker than usual.  Signs and symptoms of arterial TOS may include:   Pain and cramps in your arm or hand.   Pale arm skin.   Very cold hands.  All signs and symptoms of TOS may be worse when you hold your arms over your head.  DIAGNOSIS  Your health care provider may suspect TOS from your symptoms. A physical exam will be done. During the exam, your health care provider may ask you to hold your arms over your head to check whether your symptoms get worse. Tests may also be done to confirm the diagnosis and to find out what is causing TOS.   These may include:   Imaging studies, such as:    X-rays to look for a cervical rib.    A test using sound waves to create an image (ultrasound).    CT scan.    MRI.   A test that involves measuring and recording the pulses in your wrists (pulse volume recording).   A test that involves measuring the conduction speed of nerve impulses in your arm (nerve conduction velocity test).   A test in which X-rays are done after dye is injected into your subclavian artery or vein (venography or arteriography).  TREATMENT   Treatment depends on the type of TOS that you have.   Neurogenic TOS may be treated with:    Physical therapy to learn stretching exercises and good posture.    Occupational therapy to improve your workplace and home environment.    Medicine, including pain medicine, muscle relaxants, and anti-inflammatory medicine.     Surgery to remove scarred neck muscles or the first rib. This is rarely done for this type of TOS.   Venous TOS may be treated with:    Medicine, including blood thinners or blood clot dissolvers.    Surgery to remove a blood clot.    Surgery to remove the uppermost rib to make more space in the thoracic outlet.   Arterial TOS may be treated with surgery to:    Remove the cervical rib.    Remove a blood clot (thrombus).    Repair an aneurysm.  HOME CARE INSTRUCTIONS   Take medicines only as directed by your health care provider.   Maintain a healthy weight. Lose weight as directed by your health care provider.   Do stretching exercises at home as directed by your health care provider or physical therapist.   Maintain good posture.   Do not carry heavy bags over your shoulder.   Do not repetitively lift heavy objects over your head.   Take frequent breaks to stretch and rest your arms if you work at a keyboard or do other repetitive work with your hands and arms.   Keep all follow-up visits as directed by your health care provider. This is important.  SEEK MEDICAL CARE IF:   You have pain, cramps, numbness, or tingling in your arm or hand.   Your arm or hand frequently feels tired.   Your arm develops a darker skin color than usual.   Your hand feels cold.   You have frequent headaches or neckaches.  SEEK IMMEDIATE MEDICAL CARE IF:    You lose feeling in your arm or hand.   You are unable to move your fingers.   Your fingers turn a dark color.     This information is not intended to replace advice given to you by your health care provider. Make sure you discuss any questions you have with your health care provider.     Document Released: 01/16/2002 Document Revised: 02/16/2014 Document Reviewed: 06/28/2013  Elsevier Interactive Patient Education 2016 Elsevier Inc[employer]

## 2014-12-13 NOTE — ED Notes (Signed)
Pt reports losing feeling in her arm when she is sleeping.  She denies any CP or Jaw pain with this episode of pinching and tingling in her right arm.  Pt states she is very nervous as both her mother and brother have had a CABG.

## 2015-03-01 ENCOUNTER — Encounter (HOSPITAL_COMMUNITY): Payer: Self-pay | Admitting: *Deleted

## 2015-03-01 ENCOUNTER — Emergency Department (INDEPENDENT_AMBULATORY_CARE_PROVIDER_SITE_OTHER)
Admission: EM | Admit: 2015-03-01 | Discharge: 2015-03-01 | Disposition: A | Discharge status: Home / Self Care | Payer: Self-pay | Source: Home / Self Care | Attending: Family Medicine | Admitting: Family Medicine

## 2015-03-01 DIAGNOSIS — G5603 Carpal tunnel syndrome, bilateral upper limbs: Secondary | ICD-10-CM

## 2015-03-01 LAB — POCT I-STAT, CHEM 8
BUN: 5 mg/dL — ABNORMAL LOW (ref 6–20)
CALCIUM ION: 1.17 mmol/L (ref 1.12–1.23)
Chloride: 105 mmol/L (ref 101–111)
Creatinine, Ser: 0.9 mg/dL (ref 0.44–1.00)
GLUCOSE: 88 mg/dL (ref 65–99)
HCT: 42 % (ref 36.0–46.0)
HEMOGLOBIN: 14.3 g/dL (ref 12.0–15.0)
Potassium: 3.5 mmol/L (ref 3.5–5.1)
Sodium: 144 mmol/L (ref 135–145)
TCO2: 26 mmol/L (ref 0–100)

## 2015-03-01 NOTE — Discharge Instructions (Signed)
Wear splint at bedtime, if problem continues see orthopedist for further care.

## 2015-03-01 NOTE — ED Provider Notes (Signed)
CSN: [id number]     Arrival date & time 03/01/15  1308 History   First MD Initiated Contact with Patient 03/01/15 1404     Chief Complaint  Patient presents with  . Tingling   (Consider location/radiation/quality/duration/timing/severity/associated sxs/prior Treatment) Patient is a 40 y.o. female presenting with wrist pain. The history is provided by the patient.  Wrist Pain This is a chronic problem. Episode onset: sx for sev mos., works at [employer]. The problem has not changed since onset.Pertinent negatives include no chest pain and no abdominal pain. Associated symptoms comments: Denies caffeine.Marland Kitchen    History reviewed. No pertinent past medical history. History reviewed. No pertinent past surgical history. Family History  Problem Relation Age of Onset  . CAD Mother   . CAD Brother    Social History  Substance Use Topics  . Smoking status: Current Every Day Smoker  . Smokeless tobacco: None  . Alcohol Use: Yes   OB History    No data available     Review of Systems  Constitutional: Negative.   Cardiovascular: Negative for chest pain.  Gastrointestinal: Negative for abdominal pain.  Musculoskeletal: Negative for joint swelling.  Skin: Negative.   All other systems reviewed and are negative.   Allergies  Review of patient's allergies indicates no known allergies.  Home Medications   Prior to Admission medications   Medication Sig Start Date End Date Taking? Authorizing Provider  lisinopril-hydrochlorothiazide (PRINZIDE,ZESTORETIC) 10-12.5 MG tablet Take 1 tablet by mouth daily.    Historical Provider, MD  metFORMIN (GLUCOPHAGE) 500 MG tablet Take by mouth 2 (two) times daily with a meal.    Historical Provider, MD  naproxen (NAPROSYN) 375 MG tablet Take 1 tablet (375 mg total) by mouth 2 (two) times daily. 12/13/14   Deatra Canter, FNP   Meds Ordered and Administered this Visit  Medications - No data to display  BP 124/79 mmHg  Pulse 79  Temp(Src) 97.6 F  (36.4 C) (Oral)  Resp 16  SpO2 96%  LMP 03/01/2015 No data found.   Physical Exam  Constitutional: She is oriented to person, place, and time. She appears well-developed and well-nourished. No distress.  Neck: Normal range of motion. Neck supple. No thyromegaly present.  Cardiovascular: Regular rhythm and normal heart sounds.   Pulmonary/Chest: Effort normal and breath sounds normal.  Musculoskeletal: Normal range of motion. She exhibits no tenderness.  No sx at this time.  Lymphadenopathy:    She has no cervical adenopathy.  Neurological: She is alert and oriented to person, place, and time.  Skin: Skin is warm and dry.  Nursing note and vitals reviewed.   ED Course  Procedures (including critical care time)  Labs Review Labs Reviewed  POCT I-STAT, CHEM 8 - Abnormal; Notable for the following:    BUN 5 (*)    All other components within normal limits   i-stat wnl.  Imaging Review No results found.   Visual Acuity Review  Right Eye Distance:   Left Eye Distance:   Bilateral Distance:    Right Eye Near:   Left Eye Near:    Bilateral Near:         MDM   1. Carpal tunnel syndrome, bilateral        Linna Hoff, MD 03/01/15 1431

## 2015-03-01 NOTE — ED Notes (Signed)
Pt  Reports      Cramping  In  Arms    And  Tingling   In    extremitys      With    Symptoms  For  Several  Months         Worse   Yesterday         Pt  Is  On  bp  meds       She  denys  Any  Chest  Pain   She  Is  Sitting  Upright  On  The  Exam table  Speaking in  Complete  sentances

## 2016-02-26 ENCOUNTER — Emergency Department (HOSPITAL_COMMUNITY)
Admission: EM | Admit: 2016-02-26 | Discharge: 2016-02-26 | Disposition: A | Discharge status: Home / Self Care | Payer: BLUE CROSS/BLUE SHIELD | Attending: Emergency Medicine | Admitting: Emergency Medicine

## 2016-02-26 ENCOUNTER — Encounter (HOSPITAL_COMMUNITY): Payer: Self-pay | Admitting: *Deleted

## 2016-02-26 DIAGNOSIS — J45909 Unspecified asthma, uncomplicated: Secondary | ICD-10-CM | POA: Insufficient documentation

## 2016-02-26 DIAGNOSIS — I10 Essential (primary) hypertension: Secondary | ICD-10-CM | POA: Diagnosis not present

## 2016-02-26 DIAGNOSIS — K0889 Other specified disorders of teeth and supporting structures: Secondary | ICD-10-CM

## 2016-02-26 DIAGNOSIS — Z79899 Other long term (current) drug therapy: Secondary | ICD-10-CM | POA: Diagnosis not present

## 2016-02-26 DIAGNOSIS — Z87891 Personal history of nicotine dependence: Secondary | ICD-10-CM | POA: Diagnosis not present

## 2016-02-26 MED ORDER — IBUPROFEN 800 MG PO TABS: 800.0000 mg | ORAL_TABLET | Freq: Three times a day (TID) | ORAL | 0 refills | Status: DC

## 2016-02-26 MED ORDER — BUPIVACAINE-EPINEPHRINE (PF) 0.5% -1:200000 IJ SOLN: 1.8000 mL | Freq: Once | INTRAMUSCULAR | Status: DC

## 2016-02-26 MED ORDER — PENICILLIN V POTASSIUM 500 MG PO TABS: 500.0000 mg | ORAL_TABLET | Freq: Three times a day (TID) | ORAL | 0 refills | Status: DC

## 2016-02-26 NOTE — ED Triage Notes (Signed)
Pt states hx of dental carries.  Yesterday R face began to swell.

## 2016-02-26 NOTE — Discharge Instructions (Signed)
Please follow-up with your dentist for definitive care. Please take your medications as prescribed. Take all of your antibiotics, do not save or share them. Return to ED for any new or worsening symptoms as we discussed.  [facility] [employer]  [employer]  [facility]  [address]  [place], [place] [address]  Phone [phone]  [facility] [employer] [facility] [place] exemplifies the Dental School?s vision to improve the health and quality of life of all [place] by Public house managercreating leaders with a passion to care for the underserved and by leading the nation in community-based, service learning oral health education. We are committed to offering comprehensive general dental services for adults, children and special needs patients in a safe, caring and professional setting.  Appointments: Our clinic is open Monday through Friday 8:00 a.m. until 5:00 p.m. The amount of time scheduled for an appointment depends on the patient?s specific needs. We ask that you keep your appointed time for care or provide 24-hour notice of all appointment changes. Parents or legal guardians must accompany minor children.  Payment for Services: Medicaid and other insurance plans are welcome. Payment for services is due when services are rendered and may be made by cash or credit card. If you have dental insurance, we will assist you with your claim submission.   Emergencies: Emergency services will be provided Monday through Friday on a walk-in basis. Please arrive early for emergency services. After hours emergency services will be provided for patients of record as required.  Services:  Comprehensive General Dentistry  Children?s Dentistry  Oral Surgery - Extractions  Root Canals  Sealants and Tooth Colored Fillings  Crowns and Engineer, agriculturalBridges  Dentures and Partial Dentures   Implant Services  Periodontal Services and Cleanings  Cosmetic Tooth Whitening  Digital Radiography  3-D/[facility] Imaging

## 2016-02-26 NOTE — ED Provider Notes (Signed)
[facility]-EMERGENCY DEPT Provider Note   CSN: [id number] Arrival date & time: 02/26/16  [id number]     History   Chief Complaint Chief Complaint  Patient presents with  . Dental Pain  . Facial Swelling    HPI Gloria Burgess is a 41 y.o. female.  HPI here for evaluation of right upper dental pain. Symptoms started this morning and were gradual in onset. No relief with OTC medications. Reports associated right facial swelling. Denies fevers, chills, difficulty swallowing or breathing.  History reviewed. No pertinent past medical history.  Patient Active Problem List   Diagnosis Date Noted  . CONJUNCTIVITIS, BACTERIAL 09/26/2009  . DYSURIA 09/26/2009  . PROTEINURIA, MILD 06/05/2009  . DYSLIPIDEMIA 05/03/2009  . ACUTE TONSILLITIS 05/02/2009  . URINALYSIS, ABNORMAL 05/02/2009  . BACK PAIN 01/24/2009  . 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 METABOLIC SYNDROME X 12/08/2006  . POLYCYSTIC OVARIAN DISEASE 11/26/2006  . HYPERTENSION 11/26/2006  . ASTHMA, CHILDHOOD 11/26/2006  . 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 DERMATITIS, HANDS 11/26/2006  . 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 No pertinent surgical history.  OB History    No data available       Home Medications    Prior to Admission medications   Medication Sig Start Date End Date Taking? Authorizing Provider  ibuprofen (ADVIL,MOTRIN) 800 MG tablet Take 1 tablet (800 mg total) by mouth 3 (three) times daily. 02/26/16   Joycie PeekBenjamin Kamelia Lampkins, PA-C  lisinopril-hydrochlorothiazide (PRINZIDE,ZESTORETIC) 10-12.5 MG tablet Take 1 tablet by mouth daily.    Historical Provider, MD  metFORMIN (GLUCOPHAGE) 500 MG tablet Take by mouth 2 (two) times daily with a meal.    Historical Provider, MD  naproxen (NAPROSYN) 375 MG tablet Take 1 tablet (375 mg total) by mouth 2 (two) times daily. 12/13/14   Deatra CanterWilliam J Oxford, FNP  penicillin v potassium (VEETID) 500 MG tablet Take 1 tablet (500 mg total) by mouth 3 (three) times  daily. 02/26/16   Joycie PeekBenjamin Zuri Lascala, PA-C    Family History Family History  Problem Relation Age of Onset  . CAD Mother   . CAD Brother     Social History Social History  Substance Use Topics  . Smoking status: Former Smoker    Types: Cigarettes    Quit date: 03/29/2015  . Smokeless tobacco: Never Used  . Alcohol use Yes     Allergies   Patient has no known allergies.   Review of Systems Review of Systems See history of present illness  Physical Exam Updated Vital Signs BP 128/98 (BP Location: Right Arm)   Pulse 97   Temp 98.9 F (37.2 C) (Oral)   Resp 18   Ht 5\' 9"  (1.753 m)   Wt 115.7 kg   LMP 01/24/2016   SpO2 98%   BMI 37.66 kg/m   Physical Exam  Constitutional: She appears well-developed and well-nourished. No distress.  HENT:  Head: Normocephalic.  Discomfort located to right mandibular molars. Overall poor dentition. Multiple missing teeth with active caries. Mucous membranes are moist. No unilateral tonsillar swelling, uvula midline, no glossal swelling or elevation. No trismus. No fluctuance or evidence of a drainable abscess. No other evidence of emergent infection, Retropharyngeal or Peritonsillar abscess, Ludwig or Vincents angina. Tolerating secretions well. Patent airway. She does have diffuse swelling about right cheek, not consistent with cellulitis. Likely local inflammation   Eyes: Conjunctivae and EOM are normal.  Neck: Normal range of motion. Neck supple.  Pulmonary/Chest: Effort normal. No respiratory distress.  Abdominal: Soft. She exhibits  no distension.  Musculoskeletal: Normal range of motion.  Neurological: She is alert.  Skin: She is not diaphoretic.  Psychiatric: She has a normal mood and affect.    ED Treatments / Results  Labs (all labs ordered are listed, but only abnormal results are displayed) Labs Reviewed - No data to display  EKG  EKG Interpretation None       Radiology No results  found.  Procedures Procedures (including critical care time)  Medications Ordered in ED Medications  bupivacaine-epinephrine (MARCAINE W/ EPI) 0.5% -1:200000 injection 1.8 mL (not administered)     Initial Impression / Assessment and Plan / ED Course  I have reviewed the triage vital signs and the nursing notes.  Pertinent labs & imaging results that were available during my care of the patient were reviewed by me and considered in my medical decision making (see chart for details).  Clinical Course     Here for evaluation of dental pain. On exam, there is no evidence of a drainable abscess. No trismus, glossal elevation, unilateral tonsillar swelling. No evidence of retropharyngeal or peritonsillar abscess or Ludwig angina. Patient declines dental block in the ED. Discharged with outpatient dental resources. Also given prescription for anti-inflammatories and antibiotics. Overall, appears well, nontoxic and appropriate for discharge.   Final Clinical Impressions(s) / ED Diagnoses   Final diagnoses:  Dentalgia    New Prescriptions New Prescriptions   IBUPROFEN (ADVIL,MOTRIN) 800 MG TABLET    Take 1 tablet (800 mg total) by mouth 3 (three) times daily.   PENICILLIN V POTASSIUM (VEETID) 500 MG TABLET    Take 1 tablet (500 mg total) by mouth 3 (three) times daily.     Joycie Peek, PA-C 02/26/16 1610    Laurence Spates, MD 02/26/16 [phone]

## 2016-07-02 ENCOUNTER — Ambulatory Visit (HOSPITAL_COMMUNITY)
Admission: EM | Admit: 2016-07-02 | Discharge: 2016-07-02 | Disposition: A | Discharge status: Home / Self Care | Payer: PRIVATE HEALTH INSURANCE | Attending: Family Medicine | Admitting: Family Medicine

## 2016-07-02 ENCOUNTER — Encounter (HOSPITAL_COMMUNITY): Payer: Self-pay | Admitting: Emergency Medicine

## 2016-07-02 DIAGNOSIS — T148XXA Other injury of unspecified body region, initial encounter: Secondary | ICD-10-CM | POA: Diagnosis not present

## 2016-07-02 DIAGNOSIS — M791 Myalgia: Secondary | ICD-10-CM

## 2016-07-02 DIAGNOSIS — M25512 Pain in left shoulder: Secondary | ICD-10-CM | POA: Diagnosis not present

## 2016-07-02 HISTORY — DX: Essential (primary) hypertension: I10

## 2016-07-02 HISTORY — DX: Type 2 diabetes mellitus without complications: E11.9

## 2016-07-02 MED ORDER — NAPROXEN 375 MG PO TABS: 375.0000 mg | ORAL_TABLET | Freq: Two times a day (BID) | ORAL | 0 refills | Status: AC
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
## 2016-07-02 NOTE — ED Notes (Signed)
Updated work note with Hayden Rasmussendavid mabe, np permission
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
## 2016-07-02 NOTE — Discharge Instructions (Signed)
For today and tomorrow use cold compresses or ice to the muscles that are hurting and around the left shoulder. After that switch to heat. Perform small range of motion movements. Limit lifting, pulling and other movements that would involve the shoulder for the next few days. Take the naproxen as directed for pain. Recommend taking it with food. Massage may also be helpful.

## 2020-06-16 ENCOUNTER — Encounter (HOSPITAL_COMMUNITY): Payer: Self-pay | Admitting: *Deleted

## 2020-06-16 ENCOUNTER — Ambulatory Visit (HOSPITAL_COMMUNITY)
Admission: EM | Admit: 2020-06-16 | Discharge: 2020-06-16 | Disposition: A | Discharge status: Home / Self Care | Payer: BLUE CROSS/BLUE SHIELD | Attending: Sports Medicine | Admitting: Sports Medicine

## 2020-06-16 ENCOUNTER — Other Ambulatory Visit: Payer: Self-pay

## 2020-06-16 DIAGNOSIS — R202 Paresthesia of skin: Secondary | ICD-10-CM

## 2020-06-16 DIAGNOSIS — R200 Anesthesia of skin: Secondary | ICD-10-CM

## 2020-06-16 LAB — CBG MONITORING, ED: Glucose-Capillary: 109 mg/dL — ABNORMAL HIGH (ref 70–99)

## 2020-06-16 MED ORDER — PREDNISONE 10 MG PO TABS: 20.0000 mg | ORAL_TABLET | Freq: Every day | ORAL | 0 refills | Status: DC

## 2020-06-16 NOTE — ED Triage Notes (Signed)
Pt reports her hand feel numb and stiff

## 2020-06-16 NOTE — Discharge Instructions (Addendum)
Physical exam is reassuring. Your blood sugar was a little bit elevated at 109. I put you on a short course of steroids to see if that helps your symptoms. You need to follow-up with your primary care provider. I also gave you the name and number of the on-call orthopedic doctor if your symptoms persist she should contact them to be seen.  You may need further testing. Please see educational handouts. If your symptoms worsen in any way please go to the ER.

## 2020-06-16 NOTE — ED Provider Notes (Signed)
[facility]    CSN: [id number] Arrival date & time: 06/16/20  1006      History   Chief Complaint Chief Complaint  Patient presents with  . hand numbness    RT /LT    HPI Gloria Burgess is a 45 y.o. female.   Patient is a 45 year old female who presents for evaluation of the above issue.  He reports 1 week of bilateral elbow and hand numbness.  She says it is intermittent in nature.  It seems to come and go.  Seems to be worse in the morning.  She does sleep with her elbows flexed.  She denies any tick exposure or thyroid issues.  She sees [facility] for ongoing medical needs.  Saw them on February 28 had labs done that were normal according to her except for an elevation in her cholesterol.  She is on a statin.  I do not have access to those records as they are not in the electronic medical record.  She denies any accidents trauma or falls.  She denies any neck pain.  No polyuria polydipsia or polyphagia.  She does have a history of seasonal allergies but does not take any medicines on a regular basis.  She notes some shortness of breath at times but she uses an inhaler secondary to this.  Is worse with the change of seasons.  She denies any chest pain.  No abdominal or urinary symptoms.  She did stop smoking 3 months ago but is not related.  She has no personal history of MI or stroke.  No tick exposure.  No red flag signs or symptoms elicited on history.     Past Medical History:  Diagnosis Date  . Diabetes mellitus without complication (HCC)   . Hypertension     Patient Active Problem List   Diagnosis Date Noted  . CONJUNCTIVITIS, BACTERIAL 09/26/2009  . DYSURIA 09/26/2009  . PROTEINURIA, MILD 06/05/2009  . DYSLIPIDEMIA 05/03/2009  . ACUTE TONSILLITIS 05/02/2009  . URINALYSIS, ABNORMAL 05/02/2009  . BACK PAIN 01/24/2009  . 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 METABOLIC SYNDROME X 12/08/2006  . POLYCYSTIC OVARIAN DISEASE 11/26/2006  . HYPERTENSION  11/26/2006  . ASTHMA, CHILDHOOD 11/26/2006  . 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 DERMATITIS, HANDS 11/26/2006  . 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 No pertinent surgical history.  OB History   No obstetric history on file.      Home Medications    Prior to Admission medications   Medication Sig Start Date End Date Taking? Authorizing Provider  predniSONE (DELTASONE) 10 MG tablet Take 2 tablets (20 mg total) by mouth daily with breakfast. 06/16/20  Yes Delton See, MD  lisinopril-hydrochlorothiazide (PRINZIDE,ZESTORETIC) 10-12.5 MG tablet Take 1 tablet by mouth daily.    [provider]  metFORMIN (GLUCOPHAGE) 500 MG tablet Take by mouth 2 (two) times daily with a meal.    [provider]  naproxen (NAPROSYN) 375 MG tablet Take 1 tablet (375 mg total) by mouth 2 (two) times daily. 07/02/16   Hayden Rasmussen, NP  pravastatin (PRAVACHOL) 20 MG tablet Take 20 mg by mouth daily.    [provider]    Family History Family History  Problem Relation Age of Onset  . CAD Mother   . CAD Brother     Social History Social History   Tobacco Use  . Smoking status: Former Smoker    Types: Cigarettes    Quit date: 03/29/2015    Years since quitting:  5.2  . Smokeless tobacco: Never Used  Vaping Use  . Vaping Use: Never used  Substance Use Topics  . Alcohol use: Yes  . Drug use: Yes    Frequency: 7.0 times per week    Types: Marijuana     Allergies   Patient has no known allergies.   Review of Systems Review of Systems  Constitutional: Negative.  Negative for activity change, appetite change, chills, diaphoresis, fatigue and fever.  HENT: Negative.  Negative for congestion and trouble swallowing.   Eyes: Negative.  Negative for pain.  Respiratory: Negative.  Negative for cough, chest tightness, shortness of breath, wheezing and stridor.   Cardiovascular: Negative.  Negative for chest pain and palpitations.  Gastrointestinal:  Negative.  Negative for abdominal pain, diarrhea, nausea and vomiting.  Endocrine: Negative.  Negative for polydipsia, polyphagia and polyuria.  Genitourinary: Negative.  Negative for dysuria, frequency and urgency.  Musculoskeletal: Negative.  Negative for arthralgias, back pain, gait problem, joint swelling, myalgias, neck pain and neck stiffness.  Skin: Negative.  Negative for color change, pallor, rash and wound.  Neurological: Positive for numbness. Negative for dizziness, seizures, syncope, facial asymmetry, speech difficulty, weakness, light-headedness and headaches.  All other systems reviewed and are negative.    Physical Exam Triage Vital Signs ED Triage Vitals  Enc Vitals Group     BP 06/16/20 1033 122/83     Pulse Rate 06/16/20 1033 77     Resp 06/16/20 1033 18     Temp 06/16/20 1033 98.8 F (37.1 C)     Temp Source 06/16/20 1033 Oral     SpO2 06/16/20 1033 97 %     Weight --      Height --      Head Circumference --      Peak Flow --      Pain Score 06/16/20 1035 4     Pain Loc --      Pain Edu? --      Excl. in GC? --    No data found.  Updated Vital Signs BP 122/83 (BP Location: Left Arm)   Pulse 77   Temp 98.8 F (37.1 C) (Oral)   Resp 18   LMP 05/17/2020   SpO2 97%   Visual Acuity Right Eye Distance:   Left Eye Distance:   Bilateral Distance:    Right Eye Near:   Left Eye Near:    Bilateral Near:     Physical Exam Vitals and nursing note reviewed.  Constitutional:      General: She is not in acute distress.    Appearance: Normal appearance. She is not ill-appearing, toxic-appearing or diaphoretic.  HENT:     Head: Normocephalic and atraumatic.     Nose: Nose normal. No congestion or rhinorrhea.     Mouth/Throat:     Mouth: Mucous membranes are moist.     Pharynx: No oropharyngeal exudate or posterior oropharyngeal erythema.  Eyes:     General: No scleral icterus.       Right eye: No discharge.        Left eye: No discharge.      Extraocular Movements: Extraocular movements intact.     Conjunctiva/sclera: Conjunctivae normal.     Pupils: Pupils are equal, round, and reactive to light.  Cardiovascular:     Rate and Rhythm: Normal rate and regular rhythm.     Pulses: Normal pulses.     Heart sounds: Normal heart sounds. No murmur heard. No friction rub. No gallop.  Pulmonary:     Effort: Pulmonary effort is normal. No respiratory distress.     Breath sounds: Normal breath sounds. No stridor. No wheezing, rhonchi or rales.  Musculoskeletal:        General: No swelling, tenderness, deformity or signs of injury. Normal range of motion.     Cervical back: Normal range of motion and neck supple. No rigidity or tenderness.  Lymphadenopathy:     Cervical: No cervical adenopathy.  Skin:    General: Skin is warm and dry.     Capillary Refill: Capillary refill takes less than 2 seconds.     Findings: No bruising, erythema, lesion or rash.  Neurological:     General: No focal deficit present.     Mental Status: She is alert.     GCS: GCS eye subscore is 4. GCS verbal subscore is 5. GCS motor subscore is 6.     Cranial Nerves: Cranial nerves are intact.     Sensory: Sensation is intact.     Motor: Motor function is intact.     Coordination: Coordination is intact.     Gait: Gait is intact.     Deep Tendon Reflexes: Reflexes are normal and symmetric.     Reflex Scores:      Tricep reflexes are 2+ on the right side and 2+ on the left side.      Bicep reflexes are 2+ on the right side and 2+ on the left side.      Brachioradialis reflexes are 2+ on the right side and 2+ on the left side.      Patellar reflexes are 2+ on the right side and 2+ on the left side.      Achilles reflexes are 2+ on the right side and 2+ on the left side.    Comments: Palpation of the ulnar nerve bilaterally shows no significant hyperesthesia.  Full range of motion bilateral upper extremities.  Strength is well-preserved.  2+ DTRs.  Grip strength  is well-preserved bilaterally.  There is no issues with sensation on examination today.  Intact to light and deep touch.      UC Treatments / Results  Labs (all labs ordered are listed, but only abnormal results are displayed) Labs Reviewed  CBG MONITORING, ED - Abnormal; Notable for the following components:      Result Value   Glucose-Capillary 109 (*)    All other components within normal limits    EKG   Radiology No results found.  Procedures Procedures (including critical care time)  Medications Ordered in UC Medications - No data to display  Initial Impression / Assessment and Plan / UC Course  I have reviewed the triage vital signs and the nursing notes.  Pertinent labs & imaging results that were available during my care of the patient were reviewed by me and considered in my medical decision making (see chart for details).   Clinical impression: 1 week of bilateral upper extremity numbness and tingling.  May have ulnar nerve entrapment at the elbow although she says her hands are numb and is not really in the ulnar nerve distribution.  Tinel's test is negative bilaterally so does not appear to be carpal tunnel.  Neck exam is also within normal limits so I doubt cervical radiculopathy.  Treatment plan: 1.  The findings and treatment plan were discussed in detail with the patient.  Patient was in agreement. 2.  We will do point-of-care glucose.  It was mildly elevated 109.  She  should follow-up with her primary care provider to discuss this further and may be obtain a hemoglobin A1c in the future. 3.  We will give her the number for Ortho to see if this is something they may be able to assist her with.  She may need bilateral upper extremity nerve conduction studies and EMGs.  I will leave it to their discretion. 4.  We will put her on a short course of steroids. 5.  Follow-up with her primary care provider. 6.  Educational handouts provided. 7.  We will discharge  from care at this time and she was stable at discharge.  Follow-up here as needed.    Final Clinical Impressions(s) / UC Diagnoses   Final diagnoses:  Bilateral arm numbness and tingling while sleeping     Discharge Instructions     Physical exam is reassuring. Your blood sugar was a little bit elevated at 109. I put you on a short course of steroids to see if that helps your symptoms. You need to follow-up with your primary care provider. I also gave you the name and number of the on-call orthopedic doctor if your symptoms persist she should contact them to be seen.  You may need further testing. Please see educational handouts. If your symptoms worsen in any way please go to the ER.    ED Prescriptions    Medication Sig Dispense Auth. Provider   predniSONE (DELTASONE) 10 MG tablet Take 2 tablets (20 mg total) by mouth daily with breakfast. 10 tablet Delton See, MD     PDMP not reviewed this encounter.   Delton See, MD 06/16/20 1134

## 2020-06-16 NOTE — ED Notes (Signed)
Called pt 3x in lobby. No answer.

## 2020-07-15 ENCOUNTER — Other Ambulatory Visit: Payer: Self-pay | Admitting: Family Medicine

## 2020-07-15 DIAGNOSIS — R2231 Localized swelling, mass and lump, right upper limb: Secondary | ICD-10-CM

## 2020-07-17 ENCOUNTER — Ambulatory Visit
Admission: RE | Admit: 2020-07-17 | Discharge: 2020-07-17 | Disposition: A | Discharge status: Home / Self Care | Payer: BLUE CROSS/BLUE SHIELD | Source: Ambulatory Visit | Attending: Family Medicine | Admitting: Family Medicine

## 2020-07-17 ENCOUNTER — Other Ambulatory Visit: Payer: Self-pay

## 2020-07-17 DIAGNOSIS — R2231 Localized swelling, mass and lump, right upper limb: Secondary | ICD-10-CM

## 2020-08-13 ENCOUNTER — Encounter: Payer: Self-pay | Admitting: Neurology

## 2020-09-22 ENCOUNTER — Ambulatory Visit (HOSPITAL_COMMUNITY)
Admission: EM | Admit: 2020-09-22 | Discharge: 2020-09-22 | Disposition: A | Discharge status: Home / Self Care | Payer: BLUE CROSS/BLUE SHIELD | Attending: Physician Assistant | Admitting: Physician Assistant

## 2020-09-22 ENCOUNTER — Encounter (HOSPITAL_COMMUNITY): Payer: Self-pay | Admitting: Emergency Medicine

## 2020-09-22 ENCOUNTER — Other Ambulatory Visit: Payer: Self-pay

## 2020-09-22 DIAGNOSIS — R220 Localized swelling, mass and lump, head: Secondary | ICD-10-CM | POA: Diagnosis not present

## 2020-09-22 DIAGNOSIS — K047 Periapical abscess without sinus: Secondary | ICD-10-CM

## 2020-09-22 DIAGNOSIS — K0889 Other specified disorders of teeth and supporting structures: Secondary | ICD-10-CM

## 2020-09-22 MED ORDER — AMOXICILLIN-POT CLAVULANATE 875-125 MG PO TABS: 1.0000 | ORAL_TABLET | Freq: Two times a day (BID) | ORAL | 0 refills | Status: AC

## 2020-09-22 NOTE — Discharge Instructions (Signed)
Take Augmentin twice daily for 10 days.  Alternate Tylenol and ibuprofen for pain relief.  Use ice over this area to help with swelling.  Follow-up with a dentist as we discussed as you will likely have recurrent symptoms until we address your teeth.  If you have any worsening symptoms including continued swelling, fever, nausea, vomiting, difficulty speaking, difficulty swallowing, shortness of breath you need to go to the emergency room immediately as we discussed.

## 2020-09-22 NOTE — ED Provider Notes (Signed)
[facility]    CSN: [id number] Arrival date & time: 09/22/20  1009      History   Chief Complaint Chief Complaint  Patient presents with   Abscess    HPI Gloria Burgess is a 45 y.o. female.   Patient presents today with a 2-day history of worsening facial swelling related to dental abscess.  She reports pain is rated 10 on a 0-10 pain scale, localized to right upper premolars, described as throbbing, no alleviating factors identified.  She has tried ibuprofen without improvement of symptoms.  She has known broken teeth and severe dental cavities in this area but has not seen a dentist recently.  She denies any recent dental procedures.  Denies any recent antibiotic use.  She denies additional symptoms including fever, chest pain, shortness of breath, headache, nausea, vomiting.  Denies any muffled voice, difficulty swallowing, difficulty speaking.   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 No pertinent surgical history.  OB History   No obstetric history on file.      Home Medications    Prior to Admission medications   Medication Sig Start Date End Date Taking? Authorizing Provider  amoxicillin-clavulanate (AUGMENTIN) 875-125 MG tablet Take 1 tablet by mouth every 12 (twelve) hours for 10 days. 09/22/20 10/02/20 Yes Shemeca Lukasik, Noberto Retort, PA-C   lisinopril-hydrochlorothiazide (PRINZIDE,ZESTORETIC) 10-12.5 MG tablet Take 1 tablet by mouth daily.   Yes [provider]  pravastatin (PRAVACHOL) 20 MG tablet Take 20 mg by mouth daily.   Yes [provider]  metFORMIN (GLUCOPHAGE) 500 MG tablet Take by mouth 2 (two) times daily with a meal.    [provider]  naproxen (NAPROSYN) 375 MG tablet Take 1 tablet (375 mg total) by mouth 2 (two) times daily. 07/02/16   Hayden Rasmussen, NP  predniSONE (DELTASONE) 10 MG tablet Take 2 tablets (20 mg total) by mouth daily with breakfast. Patient not taking: Reported on 09/22/2020 06/16/20   Delton See, MD    Family History Family History  Problem Relation Age of Onset   CAD Mother    CAD Brother     Social History Social History   Tobacco Use   Smoking status: Former    Types: Cigarettes    Quit date: 03/29/2015    Years since quitting: 5.4   Smokeless tobacco: Never  Vaping Use   Vaping Use: Never used  Substance Use Topics   Alcohol use: Yes   Drug use: Not Currently    Frequency: 7.0 times per week    Types: Marijuana     Allergies   Patient has no known allergies.   Review of Systems Review of Systems  Constitutional:  Positive for activity change. Negative for appetite change, fatigue and fever.  HENT:  Positive for dental problem and facial swelling. Negative for congestion, sinus pressure, sneezing, sore throat, trouble swallowing and voice change.   Respiratory:  Negative for cough and shortness of breath.   Cardiovascular:  Negative for chest pain.  Gastrointestinal:  Negative for abdominal pain, diarrhea, nausea and vomiting.  Neurological:  Negative for dizziness, light-headedness and headaches.    Physical Exam Triage Vital Signs ED Triage Vitals  Enc Vitals Group     BP 09/22/20 1046 125/70     Pulse Rate 09/22/20 1046 93     Resp 09/22/20 1046 20     Temp 09/22/20 1046 98.3 F (36.8 C)     Temp Source 09/22/20 1046 Oral     SpO2  09/22/20 1046 97 %     Weight --      Height --      Head Circumference --      Peak Flow --      Pain Score 09/22/20 1043 10     Pain Loc --      Pain Edu? --      Excl. in GC? --    No data found.  Updated Vital Signs BP 125/70 (BP Location: Left Arm) Comment (BP Location): large cuff  Pulse 93   Temp 98.3 F (36.8 C) (Oral)   Resp 20   LMP 08/24/2020   SpO2 97%   Visual Acuity Right Eye Distance:   Left Eye Distance:   Bilateral Distance:    Right Eye Near:   Left Eye Near:    Bilateral Near:     Physical Exam Vitals reviewed.  Constitutional:      General: She is awake. She is not in acute distress.    Appearance: Normal appearance. She is normal weight. She is not ill-appearing.     Comments: Very pleasant female appears stated age no acute distress sitting comfortably in exam room  HENT:     Head: Normocephalic and atraumatic.     Jaw: Swelling present.      Right Ear: External ear normal.     Left Ear: External ear normal.     Nose: Nose normal.     Mouth/Throat:     Dentition: Abnormal dentition. Dental tenderness, gingival swelling, dental caries and dental abscesses present.      Comments: No evidence of Ludwig angina Cardiovascular:     Rate and Rhythm: Normal rate and regular rhythm.     Heart sounds: Normal heart sounds, S1 normal and S2 normal. No murmur heard. Pulmonary:     Effort: Pulmonary effort is normal.     Breath sounds: Normal breath sounds. No wheezing, rhonchi or rales.     Comments: Clear to auscultation bilaterally Abdominal:     Palpations: Abdomen is soft.     Tenderness: There is no abdominal tenderness.  Psychiatric:        Behavior: Behavior is cooperative.     UC Treatments / Results  Labs (all labs ordered are listed, but only abnormal results are displayed) Labs Reviewed - No data to display  EKG   Radiology No results found.  Procedures Procedures (including critical care time)  Medications Ordered in  UC Medications - No data to display  Initial Impression / Assessment and Plan / UC Course  I have reviewed the triage vital signs and the nursing notes.  Pertinent labs & imaging results that were available during my care of the patient were reviewed by me and considered in my medical decision making (see chart for details).      Symptoms consistent with dental infection.  Patient was started on Augmentin twice daily for 10  days.  Recommended she use over-the-counter analgesics to manage pain relief by alternating Tylenol and ibuprofen.  Discussed the importance of following up with a dentist as symptoms will likely recur until underlying tooth decay is addressed.  She was given contact information for local provider and encouraged to contact them to schedule appointment at her convenience.  Discussed alarm symptoms that warrant emergent evaluation including difficulty speaking, difficulty swallowing, changes in voice, high fever, nausea, vomiting.  Strict return precautions given to which patient expressed understanding.  Final Clinical Impressions(s) / UC Diagnoses   Final diagnoses:  Dental abscess  Dentalgia  Facial swelling     Discharge Instructions      Take Augmentin twice daily for 10 days.  Alternate Tylenol and ibuprofen for pain relief.  Use ice over this area to help with swelling.  Follow-up with a dentist as we discussed as you will likely have recurrent symptoms until we address your teeth.  If you have any worsening symptoms including continued swelling, fever, nausea, vomiting, difficulty speaking, difficulty swallowing, shortness of breath you need to go to the emergency room immediately as we discussed.     ED Prescriptions     Medication Sig Dispense Auth. Provider   amoxicillin-clavulanate (AUGMENTIN) 875-125 MG tablet Take 1 tablet by mouth every 12 (twelve) hours for 10 days. 20 tablet Pearse Shiffler, Noberto Retort, PA-C      PDMP not reviewed this encounter.   Jeani Hawking, PA-C 09/22/20 1111

## 2020-09-22 NOTE — ED Triage Notes (Addendum)
Patient has an abscess to right side of face.  Reports history of the same.  States she has 2-3 teeth that are broken.  Patient has taken ibuprofen with no relief.  Symptoms started yesterday morning.  Patient has swelling to right side of face, swelling from right jaw and including swelling around right eye, right side of nose/bridge of nose

## 2020-09-25 ENCOUNTER — Ambulatory Visit: Payer: PRIVATE HEALTH INSURANCE | Admitting: Neurology

## 2020-10-18 ENCOUNTER — Ambulatory Visit: Payer: PRIVATE HEALTH INSURANCE | Admitting: Neurology

## 2020-12-06 ENCOUNTER — Other Ambulatory Visit: Payer: Self-pay | Admitting: Family Medicine

## 2020-12-06 DIAGNOSIS — Z1231 Encounter for screening mammogram for malignant neoplasm of breast: Secondary | ICD-10-CM

## 2021-07-08 ENCOUNTER — Encounter: Payer: Self-pay | Admitting: Podiatry

## 2021-07-08 ENCOUNTER — Ambulatory Visit: Payer: 59 | Admitting: Podiatry

## 2021-07-08 DIAGNOSIS — L600 Ingrowing nail: Secondary | ICD-10-CM

## 2021-07-08 DIAGNOSIS — B351 Tinea unguium: Secondary | ICD-10-CM

## 2021-07-08 NOTE — Patient Instructions (Signed)

## 2021-07-14 NOTE — Progress Notes (Signed)
Subjective:   Patient ID: Gloria Burgess, female   DOB: 46 y.o.   MRN: [id number]   HPI 46 year old female presents the office today with concerns of ingrown toenail, left medial nail border which started out 2 months ago.  She had some drainage and discomfort and some swelling and redness.  Started after the nail was trimmed.  She tried soaking as well as antibiotics.  Possibly nails be trimmed as are thickened elongated as well.  Last A1c was 12.2.  It was rechecked but apparently was an "error".    Review of Systems  All other systems reviewed and are negative.  Past Medical History:  Diagnosis Date   Diabetes mellitus without complication ([place])    Hypertension     No past surgical history on file.   Current Outpatient Medications:    lisinopril-hydrochlorothiazide (PRINZIDE,ZESTORETIC) 10-12.5 MG tablet, Take 1 tablet by mouth daily., Disp: , Rfl:    metFORMIN (GLUCOPHAGE) 500 MG tablet, Take by mouth 2 (two) times daily with a meal., Disp: , Rfl:    naproxen (NAPROSYN) 375 MG tablet, Take 1 tablet (375 mg total) by mouth 2 (two) times daily., Disp: 20 tablet, Rfl: 0   pravastatin (PRAVACHOL) 20 MG tablet, Take 20 mg by mouth daily., Disp: , Rfl:    predniSONE (DELTASONE) 10 MG tablet, Take 2 tablets (20 mg total) by mouth daily with breakfast. (Patient not taking: Reported on 09/22/2020), Disp: 10 tablet, Rfl: 0  No Known Allergies       Objective:  Physical Exam  General: AAO x3, NAD  Dermatological: There is incurvation present on the left medial nail border with localized edema and erythema there is no drainage or pus noted at this time.  In general nails are hypertrophic, dystrophic.  No open lesions.  Vascular: Dorsalis Pedis artery and Posterior Tibial artery pedal pulses are 2/4 bilateral with immedate capillary fill time.  There is no pain with calf compression, swelling, warmth, erythema.   Neruologic: Grossly intact via light touch bilateral.    Musculoskeletal: Tenderness on the left medial nail border.  Muscular strength 5/5 in all groups tested bilateral.  Gait: Unassisted, Nonantalgic.       Assessment:   Ingrown toenail left medial nail border     Plan:  -Treatment options discussed including all alternatives, risks, and complications -Etiology of symptoms were discussed -At this time, we discussed partial nail removal without chemical matricectomy to the left medial due to infection.  We discussed increased A1c puts her at increased risk of complications and therefore did not proceed with chemical matricectomy.  Risks and complications were discussed with the patient for which they understand and  verbally consent to the procedure. Under sterile conditions a total of 3 mL of a mixture of 2% lidocaine plain and 0.5% Marcaine plain was infiltrated in a hallux block fashion. Once anesthetized, the skin was prepped in sterile fashion. A tourniquet was then applied. Next the medial border of the hallux nail border was sharply excised making sure to remove the entire offending nail border. Once the nail was  Removed, the area was debrided and the underlying skin was intact. The area was irrigated and hemostasis was obtained.  A dry sterile dressing was applied. After application of the dressing the tourniquet was removed and there is found to be an immediate capillary refill time to the digit. The patient tolerated the procedure well any complications. Post procedure instructions were discussed the patient for which he verbally understood. Follow-up in  one week for nail check or sooner if any problems are to arise. Discussed signs/symptoms of worsening infection and directed to call the office immediately should any occur or go directly to the emergency room. In the meantime, encouraged to call the office with any questions, concerns, changes symptoms. -Sharply debrided the other nails without any complication or bleeding.  Trula Slade DPM

## 2021-07-17 ENCOUNTER — Telehealth: Payer: Self-pay | Admitting: *Deleted

## 2021-07-17 NOTE — Telephone Encounter (Signed)
Gloria Burgess is requesting a systematic diagnostic code, one given is definity (B35.1)for billing purposes. Ref [id number]. Returned call and gave them L60.0,which she did accept.

## 2021-07-22 ENCOUNTER — Ambulatory Visit: Payer: 59 | Admitting: Podiatry

## 2021-07-22 DIAGNOSIS — L603 Nail dystrophy: Secondary | ICD-10-CM | POA: Diagnosis not present

## 2021-07-22 NOTE — Patient Instructions (Signed)

## 2021-07-29 NOTE — Progress Notes (Signed)
Subjective: 46 year old female presents the office today for follow evaluation undergoing partial nail avulsion of her left big toe.  She states been healing well.  No swelling redness or any drainage that she reports.  She still been soaking in Epsom salts.  Objective: AAO x3, NAD DP/PT pulses palpable bilaterally, CRT less than 3 seconds Status post partial nail avulsion appears, greenish tissue is still present but there is no cellulitis or ascending cellulitis.  There is no drainage or pus.  No fluctuance or crepitation.  There is no pain on exam. Nails in general still hypertrophic, dystrophic. No pain with calf compression, swelling, warmth, erythema  Assessment: Status post partial avulsion, onychomycosis  Plan: -All treatment options discussed with the patient including all alternatives, risks, complications.  -From a ingrown toenail procedure standpoint continue washing with soap and water, dry thoroughly apply a small amount of antibiotic ointment and a bandage to the day we can leave the area open at nighttime. -I reviewed the nail culture with her which did not reveal onychomycosis however more dystrophy of the nail.  In clinically the toes appear to have discoloration consistent fungus.  I would order compound cream to include antifungal medication as well as urea to help with the damage to the nails.  She complains of the other nails without weight on the big toenail until the procedure site is completely healed. -Patient encouraged to call the office with any questions, concerns, change in symptoms.   Vivi Barrack DPM

## 2021-09-25 ENCOUNTER — Encounter (HOSPITAL_COMMUNITY): Payer: Self-pay | Admitting: *Deleted

## 2021-09-25 ENCOUNTER — Ambulatory Visit (HOSPITAL_COMMUNITY)
Admission: EM | Admit: 2021-09-25 | Discharge: 2021-09-25 | Disposition: A | Discharge status: Home / Self Care | Payer: 59 | Attending: Sports Medicine | Admitting: Sports Medicine

## 2021-09-25 DIAGNOSIS — K029 Dental caries, unspecified: Secondary | ICD-10-CM | POA: Diagnosis not present

## 2021-09-25 DIAGNOSIS — K047 Periapical abscess without sinus: Secondary | ICD-10-CM | POA: Diagnosis not present

## 2021-09-25 MED ORDER — AMOXICILLIN-POT CLAVULANATE 875-125 MG PO TABS: 1.0000 | ORAL_TABLET | Freq: Two times a day (BID) | ORAL | 0 refills | Status: AC

## 2021-09-25 MED ORDER — NAPROXEN 500 MG PO TABS: 500.0000 mg | ORAL_TABLET | Freq: Two times a day (BID) | ORAL | 0 refills | Status: AC | PRN

## 2021-09-25 NOTE — ED Provider Notes (Signed)
[facility]    CSN: [id number] Arrival date & time: 09/25/21  1884      History   Chief Complaint Chief Complaint  Patient presents with   Dental Pain    HPI Gloria Burgess is a 46 y.o. female.   She presented today with chief complaint of right upper sided tooth pain.  She states her pain began on Tuesday evening and has not been improving.  It woke her up in the middle of the night last night which prompted her to come be evaluated.  She did take 1 old Augmentin antibiotic pill that she had leftover from a prior tooth infection last year.  She has also been using naproxen which has helped a little bit with her pain.  She noticed some swelling of her upper cheek this morning.  But denies any shortness of breath, fevers, chills, difficulty speaking or swallowing.  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      Home Medications    Prior to Admission medications   Medication Sig Start Date End Date Taking? Authorizing Provider  AMLODIPINE BENZOATE PO Take by mouth.   Yes [provider]  amoxicillin-clavulanate (AUGMENTIN) 875-125 MG tablet Take 1 tablet by mouth every 12 (twelve) hours for 7 days. 09/25/21 10/02/21 Yes Claudie Leach,  MD  lisinopril-hydrochlorothiazide (PRINZIDE,ZESTORETIC) 10-12.5 MG tablet Take 1 tablet by mouth daily.   Yes [provider]  metFORMIN (GLUCOPHAGE) 500 MG tablet Take by mouth 2 (two) times daily with a meal.   Yes [provider]  naproxen (NAPROSYN) 375 MG tablet Take 1 tablet (375 mg total) by mouth 2 (two) times daily. 07/02/16  Yes Mabe, Onalee Hua, NP  naproxen (NAPROSYN) 500 MG tablet Take 1 tablet (500 mg total) by mouth 2 (two) times daily as needed. 09/25/21  Yes Claudie Leach, MD  pravastatin (PRAVACHOL) 20 MG tablet Take 20 mg by mouth daily.   Yes [provider]    Family History Family History  Problem Relation Age of Onset   CAD Mother    CAD Brother     Social History Social History   Tobacco Use   Smoking status: Former    Types: Cigarettes    Quit date: 03/29/2015    Years since quitting: 6.4   Smokeless tobacco: Never  Vaping Use   Vaping Use: Never used  Substance Use Topics   Alcohol use: Yes    Comment: occasionally   Drug use: Not Currently    Types: Marijuana     Allergies   Patient has no known allergies.   Review of Systems Review of Systems as listed above in HPI   Physical Exam Triage Vital Signs ED Triage Vitals  Enc Vitals Group  BP 09/25/21 0824 130/85     Pulse Rate 09/25/21 0824 (!) 113     Resp 09/25/21 0824 18     Temp 09/25/21 0824 99.6 F (37.6 C)     Temp Source 09/25/21 0824 Oral     SpO2 09/25/21 0824 96 %     Weight --      Height --      Head Circumference --      Peak Flow --      Pain Score 09/25/21 0825 6     Pain Loc --      Pain Edu? --      Excl. in GC? --    No data found.  Updated Vital Signs BP 130/85   Pulse (!) 113   Temp 99.6 F (37.6 C) (Oral)   Resp 18   LMP 09/01/2021 (Approximate)   SpO2 96%   Physical Exam Vitals reviewed.  Constitutional:      General: She is not in acute distress.    Appearance: Normal appearance. She is obese. She is not  ill-appearing, toxic-appearing or diaphoretic.  HENT:     Head:     Jaw: Swelling present. No trismus.     Mouth/Throat:     Mouth: Mucous membranes are moist.     Dentition: Abnormal dentition. Dental caries present. No dental abscesses.     Pharynx: Oropharynx is clear. Uvula midline.      Comments: Patient had multiple broken off teeth from the gums, as dictated above by red X, accompanied by overall poor dentition.  She did have some erythema located around the back molar stub, as indicated by red circle.  No drainage from the area seen today.  Tenderness to palpation over the gums of the posterior molar. Cardiovascular:     Rate and Rhythm: Tachycardia present.     Pulses: Normal pulses.  Pulmonary:     Effort: Pulmonary effort is normal.  Neurological:     Mental Status: She is alert.      UC Treatments / Results  Labs (all labs ordered are listed, but only abnormal results are displayed) Labs Reviewed - No data to display  EKG   Radiology No results found.  Procedures Procedures (including critical care time)  Medications Ordered in UC Medications - No data to display  Initial Impression / Assessment and Plan / UC Course  I have reviewed the triage vital signs and the nursing notes.  Pertinent labs & imaging results that were available during my care of the patient were reviewed by me and considered in my medical decision making (see chart for details).     Gloria Burgess presented today with likely dental infection secondary to poor dentition.  She does not follow with a dentist regularly.  Due to the erythema and swelling around her upper posterior molar I prescribed Augmentin for 7 days, along with naproxen for pain and swelling.  Patient was given a handout at discharge of local low-cost dentistry.  She stated she would call today.  I counseled her on worsening of symptoms and to return to urgent care if she has no improvement.  She verbalized  understanding. Patient was tachycardic during my evaluation, she stated she is not feeling nervous, denies any palpitations or chest pain.  She did just have her morning coffee which is likely the culprit of her tachycardia.  Patient counseled to follow-up with her primary care provider.  Final Clinical Impressions(s) / UC Diagnoses   Final diagnoses:  Dental infection  Dental caries     Discharge Instructions      Take your antibiotic twice a day for 7 days.  You may also use naproxen twice a day as needed for pain.  I would recommend calling as soon as possible to seek appointment for dental care.  If you have any change in your symptoms, fevers, chills worsening pain before you are able to see the dentist please return to urgent care for evaluation.    ED Prescriptions     Medication Sig Dispense Auth. Provider   amoxicillin-clavulanate (AUGMENTIN) 875-125 MG tablet Take 1 tablet by mouth every 12 (twelve) hours for 7 days. 14 tablet Claudie Leach, MD   naproxen (NAPROSYN) 500 MG tablet Take 1 tablet (500 mg total) by mouth 2 (two) times daily as needed. 30 tablet Claudie Leach, MD      PDMP not reviewed this encounter.   Claudie Leach, MD 09/25/21 [phone]

## 2021-09-25 NOTE — ED Triage Notes (Signed)
C/O right upper dental pain and swelling onset 2 days ago. No known fevers. Took one old leftover abx pill. Taking Naproxen - last dose last night.

## 2021-09-25 NOTE — Discharge Instructions (Signed)
Take your antibiotic twice a day for 7 days.  You may also use naproxen twice a day as needed for pain.  I would recommend calling as soon as possible to seek appointment for dental care.  If you have any change in your symptoms, fevers, chills worsening pain before you are able to see the dentist please return to urgent care for evaluation.

## 2022-07-03 ENCOUNTER — Ambulatory Visit: Payer: Commercial Managed Care - HMO | Admitting: Podiatry

## 2022-07-03 DIAGNOSIS — R52 Pain, unspecified: Secondary | ICD-10-CM

## 2022-07-03 DIAGNOSIS — L600 Ingrowing nail: Secondary | ICD-10-CM

## 2022-07-03 NOTE — Patient Instructions (Signed)

## 2022-07-03 NOTE — Progress Notes (Addendum)
Subjective: Chief Complaint  Patient presents with   Ingrown Toenail    Possible ingrown to right hallux medial border. Patient is currently taking antibiotics that were prescribed by PCP.    47 year old female presents the office with above concerns, which is new.  She states that she make any pain, ingrowing along the right LATERAL boarder. She is on keflex but did not complete it and she saved some for after the procedure.  Objective: AAO x3, NAD DP/PT pulses palpable bilaterally, CRT less than 3 seconds Incurvation present to right lateral nail border.  Localized edema.  No ascending size.  No drainage or pus.  No fluctuance or crepitation.  No open lesions. No pain with calf compression, swelling, warmth, erythema  Assessment: Right lateral hallux ingrown toenail  Plan: -All treatment options discussed with the patient including all alternatives, risks, complications.  -At this time, the patient is requesting partial nail removal with chemical matricectomy to the symptomatic portion of the nail. Risks and complications were discussed with the patient for which they understand and written consent was obtained. Under sterile conditions a total of 3 mL of a mixture of 2% lidocaine plain and 0.5% Marcaine plain was infiltrated in a hallux block fashion. Once anesthetized, the skin was prepped in sterile fashion. A tourniquet was then applied. Next the lateral aspect of hallux nail border was then sharply excised making sure to remove the entire offending nail border. Once the nails were ensured to be removed area was debrided and the underlying skin was intact. There is no purulence identified in the procedure. Next phenol was then applied under standard conditions and copiously irrigated. Silvadene was applied. A dry sterile dressing was applied. After application of the dressing the tourniquet was removed and there is found to be an immediate capillary refill time to the digit. The patient  tolerated the procedure well any complications. Post procedure instructions were discussed the patient for which he verbally understood. Discussed signs/symptoms of infection and directed to call the office immediately should any occur or go directly to the emergency room. In the meantime, encouraged to call the office with any questions, concerns, changes symptoms. -Patient encouraged to call the office with any questions, concerns, change in symptoms.   Vivi Barrack DPM

## 2022-07-10 NOTE — Addendum Note (Signed)
Addended by: Ovid Curd R on: 07/10/2022 03:14 PM   Modules accepted: Level of Service

## 2022-07-17 IMAGING — US US EXTREM  UP VENOUS*R*
1 series · 13 of 24 positions shown · non-contrast
Comparison: None.

CLINICAL DATA: Right upper extremity pain and edema. Evaluate for
DVT.



[Series 1: us extrem up venous*right* · 0.07mm/px · 13 of 51 slices shown]
[im 1/51]
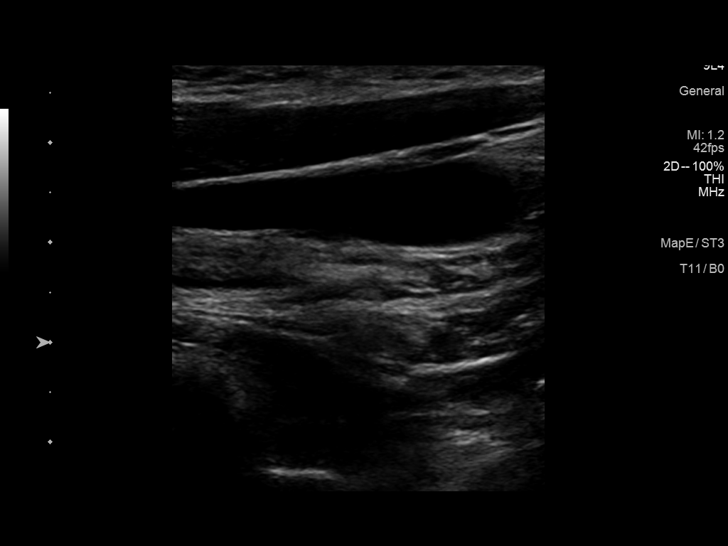
[im 5/51]
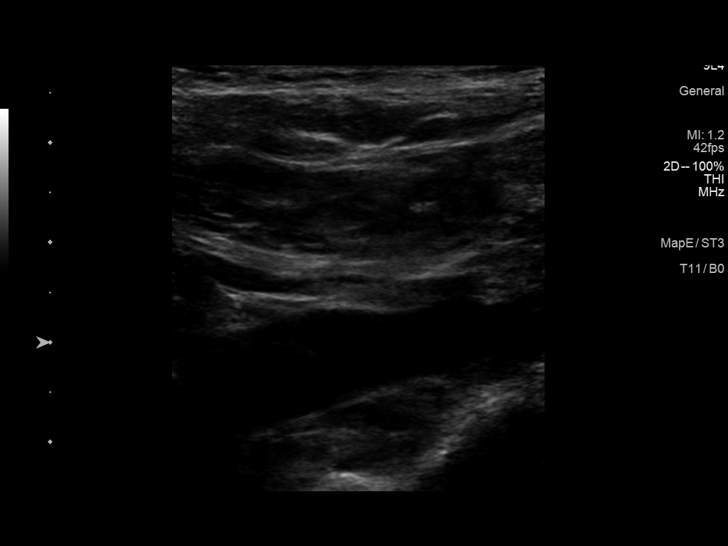
[im 9/51]
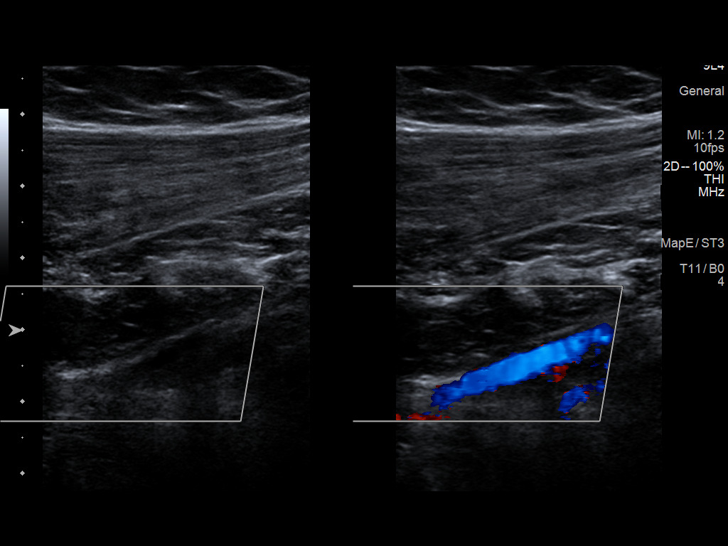
[im 14/51]
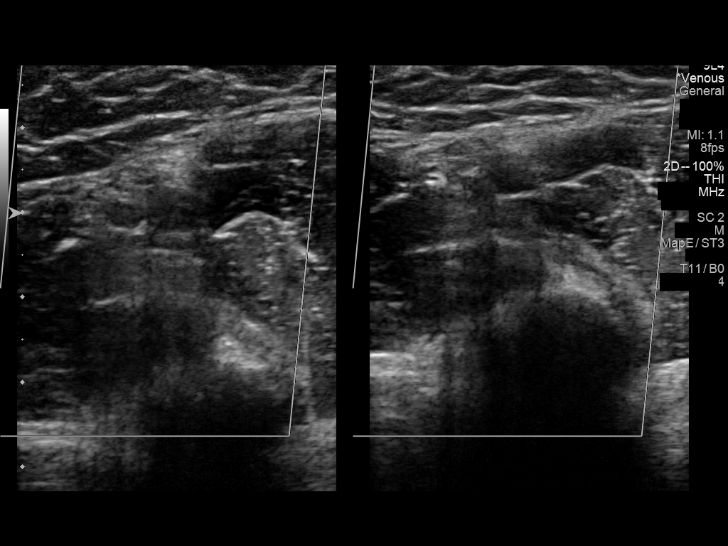
[im 18/51]
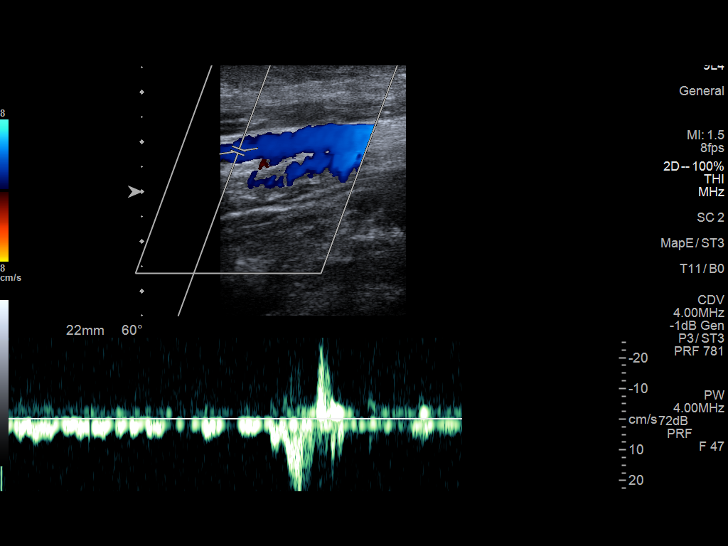
[im 22/51]
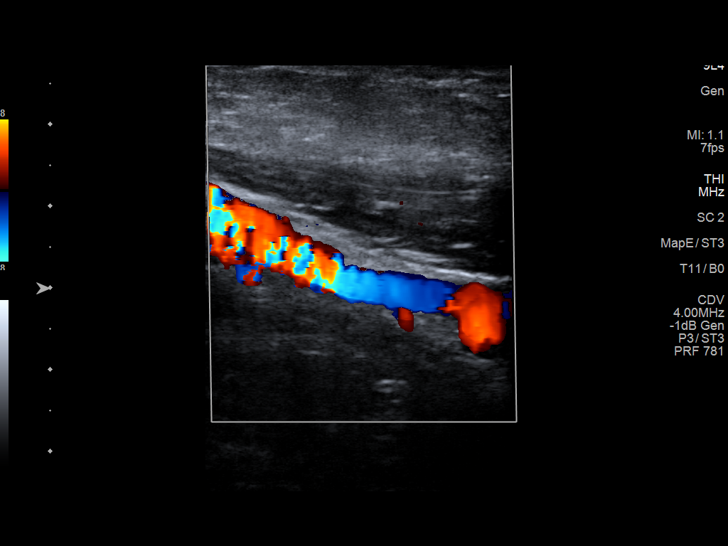
[im 27/51]
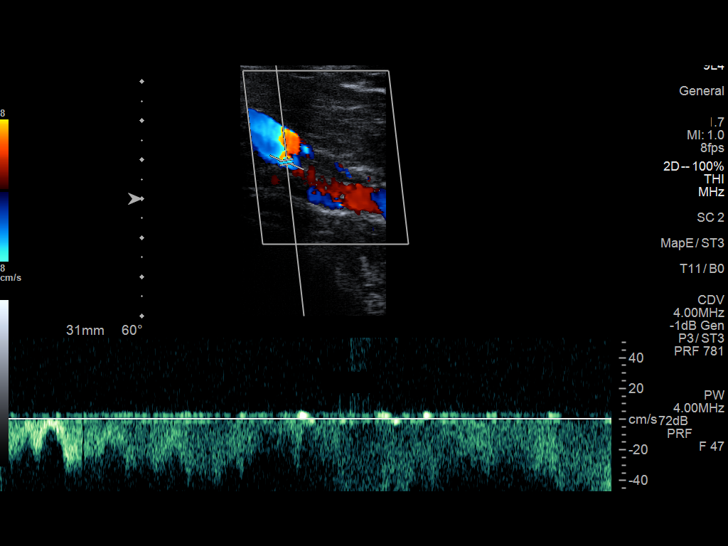
[im 29/51]
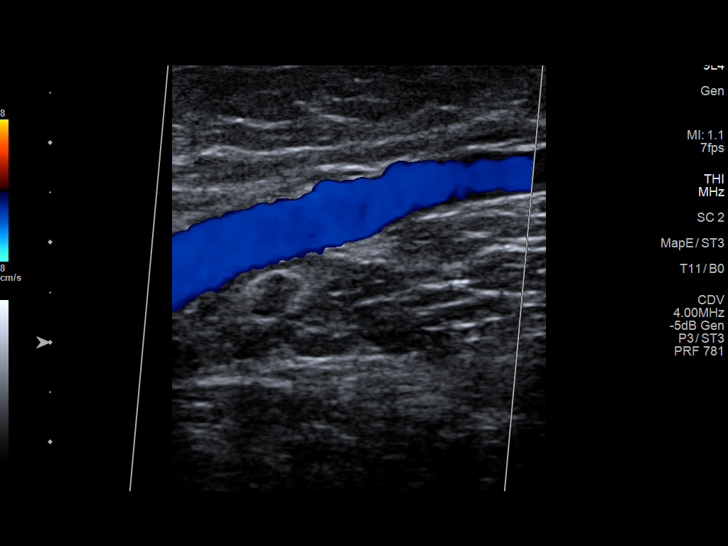
[im 33/51]
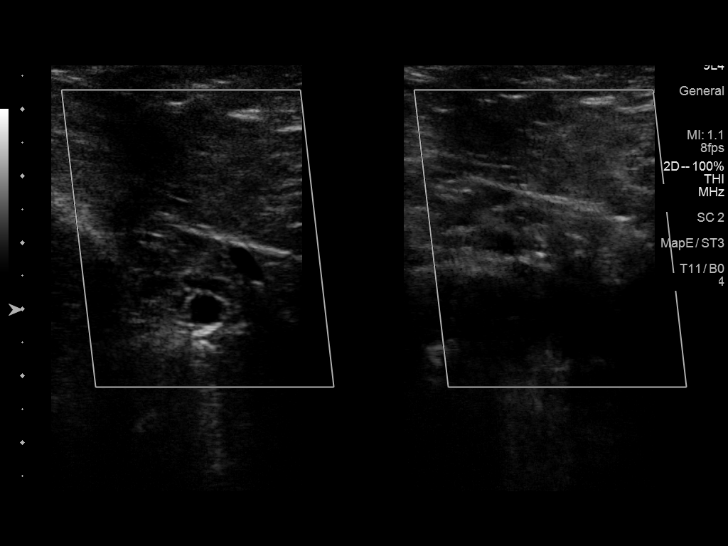
[im 37/51]
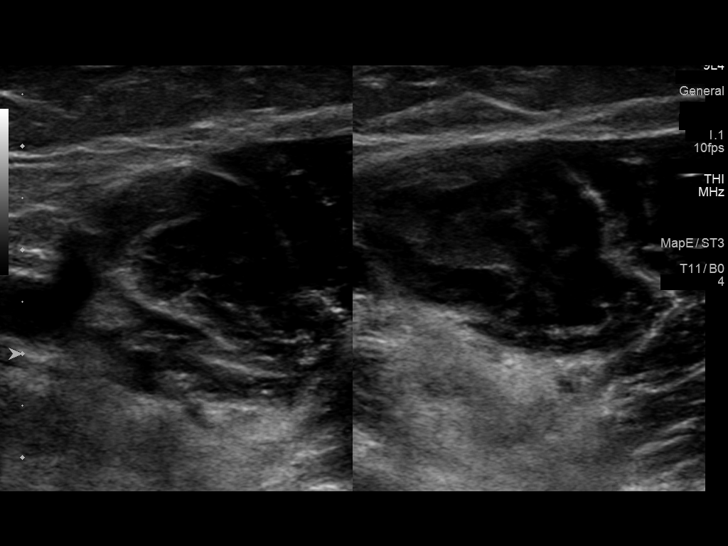
[im 42/51]
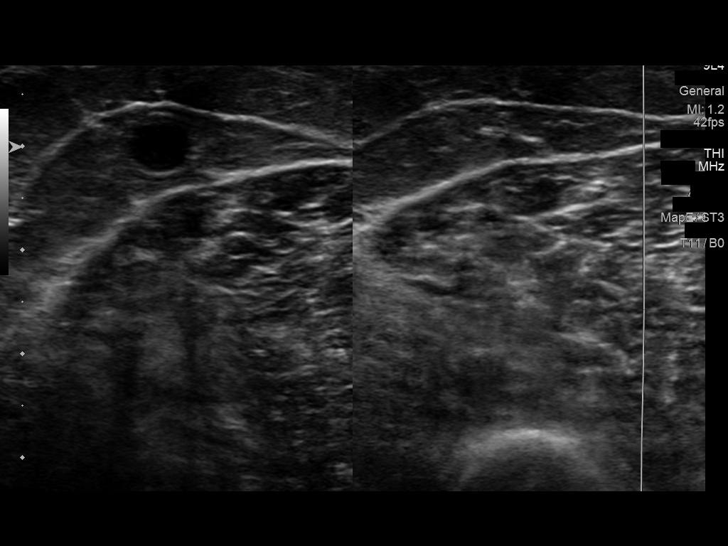
[im 46/51]
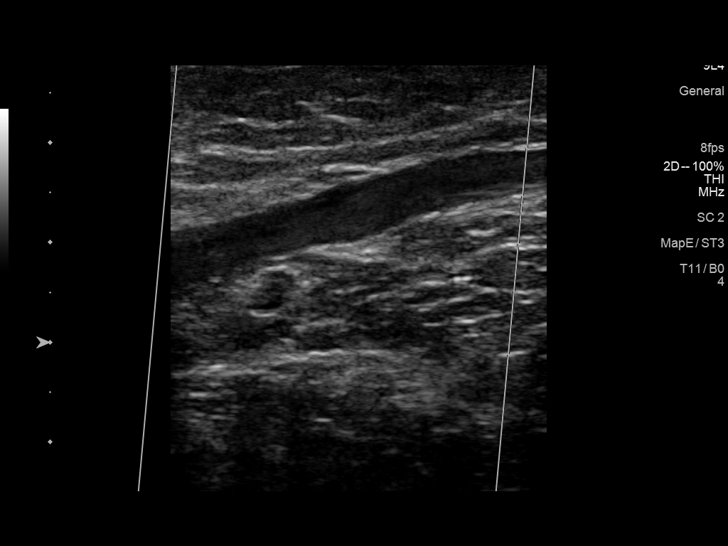
[im 51/51]
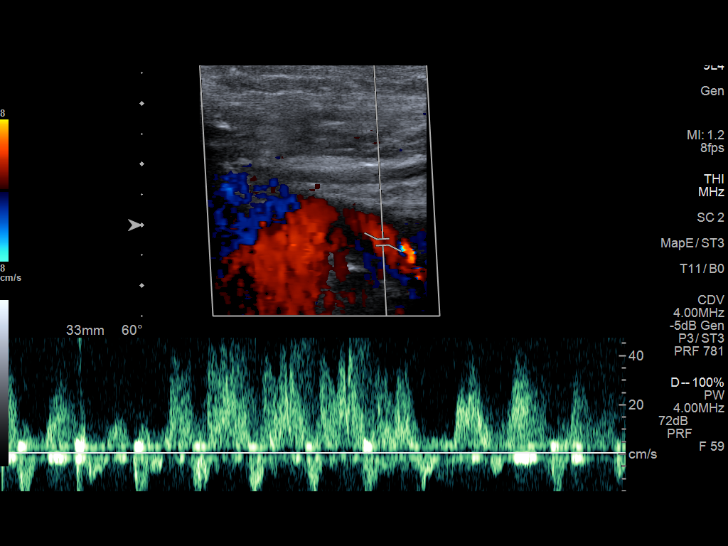

[13 of 24 positions shown; findings below may reference images not displayed]

FINDINGS: Contralateral Subclavian Vein: Respiratory phasicity is normal and
symmetric with the symptomatic side. No evidence of thrombus. Normal
compressibility.

Internal Jugular Vein: No evidence of thrombus. Normal
compressibility, respiratory phasicity and response to augmentation.

Subclavian Vein: No evidence of thrombus. Normal compressibility,
respiratory phasicity and response to augmentation.

Axillary Vein: No evidence of thrombus. Normal compressibility,
respiratory phasicity and response to augmentation.

Cephalic Vein: No evidence of thrombus. Normal compressibility,
respiratory phasicity and response to augmentation.

Basilic Vein: No evidence of thrombus. Normal compressibility,
respiratory phasicity and response to augmentation.

Brachial Veins: No evidence of thrombus. Normal compressibility,
respiratory phasicity and response to augmentation.

Radial Veins: No evidence of thrombus. Normal compressibility,
respiratory phasicity and response to augmentation.

Ulnar Veins: No evidence of thrombus. Normal compressibility,
respiratory phasicity and response to augmentation.

Venous Reflux:  None visualized.

Other Findings:  None visualized.
IMPRESSION: No evidence of DVT within the right upper extremity.

## 2022-07-27 ENCOUNTER — Ambulatory Visit (INDEPENDENT_AMBULATORY_CARE_PROVIDER_SITE_OTHER): Payer: Commercial Managed Care - HMO | Admitting: Podiatry

## 2022-07-27 DIAGNOSIS — L600 Ingrowing nail: Secondary | ICD-10-CM | POA: Diagnosis not present

## 2022-07-27 NOTE — Progress Notes (Signed)
Subjective: Chief Complaint  Patient presents with   Ingrown Toenail    Nail check    47 year old female presents the office today for follow-up evaluation after undergoing partial nail avulsion of the right lateral nail border.  States is doing well.  No drainage or pus.  Soreness is also improved.  Objective: AAO x3, NAD DP/PT pulses palpable bilaterally, CRT less than 3 seconds Status post partial nail avulsion.  There is mild scab still present but there is no significant swelling and there is no erythema, ascending cellulitis.  No drainage or pus or signs of infection. No pain with calf compression, swelling, warmth, erythema  Assessment: Status post partial nail avulsion, healing well  Plan: -All treatment options discussed with the patient including all alternatives, risks, complications.  -Overall doing better.  She is soaking Epsom salts or at least washing with soap and water daily.  Apply a small amount of antibiotic ointment and dressing.  Can leave the area at nighttime.  Monitor for any signs or symptoms of infection or reoccurrence. -Patient encouraged to call the office with any questions, concerns, change in symptoms.   Vivi Barrack DPM

## 2022-07-27 NOTE — Patient Instructions (Signed)

## 2023-10-23 LAB — COLOGUARD: COLOGUARD: NEGATIVE

## 2024-02-15 DIAGNOSIS — Z1231 Encounter for screening mammogram for malignant neoplasm of breast: Secondary | ICD-10-CM

## 2024-02-17 ENCOUNTER — Ambulatory Visit
Admission: RE | Admit: 2024-02-17 | Discharge: 2024-02-17 | Disposition: A | Discharge status: Home / Self Care | Source: Ambulatory Visit | Attending: Family Medicine | Admitting: Family Medicine

## 2024-02-17 DIAGNOSIS — Z1231 Encounter for screening mammogram for malignant neoplasm of breast: Secondary | ICD-10-CM
